# Patient Record
Sex: Male | Born: 1955 | Race: White | Hispanic: No | Marital: Married | Smoking: Never smoker
Health system: Southern US, Community
[De-identification: ages and names within clinical notes are randomized; demographics above are authoritative.]

## PROBLEM LIST (undated history)

## (undated) DIAGNOSIS — R079 Chest pain, unspecified: Secondary | ICD-10-CM

## (undated) DIAGNOSIS — T7840XA Allergy, unspecified, initial encounter: Secondary | ICD-10-CM

## (undated) HISTORY — DX: Allergy, unspecified, initial encounter: T78.40XA

## (undated) HISTORY — DX: Chest pain, unspecified: R07.9

## (undated) HISTORY — PX: OTHER SURGICAL HISTORY: SHX169

## (undated) HISTORY — PX: CAROTID STENT: SHX1301

---

## 2019-04-05 DIAGNOSIS — D229 Melanocytic nevi, unspecified: Secondary | ICD-10-CM

## 2019-04-05 HISTORY — DX: Melanocytic nevi, unspecified: D22.9

## 2020-04-01 DIAGNOSIS — M19041 Primary osteoarthritis, right hand: Secondary | ICD-10-CM | POA: Diagnosis not present

## 2020-04-01 DIAGNOSIS — M180 Bilateral primary osteoarthritis of first carpometacarpal joints: Secondary | ICD-10-CM | POA: Diagnosis not present

## 2020-04-01 DIAGNOSIS — M79641 Pain in right hand: Secondary | ICD-10-CM | POA: Diagnosis not present

## 2020-04-01 DIAGNOSIS — M79642 Pain in left hand: Secondary | ICD-10-CM | POA: Diagnosis not present

## 2020-04-01 DIAGNOSIS — G5601 Carpal tunnel syndrome, right upper limb: Secondary | ICD-10-CM | POA: Diagnosis not present

## 2020-05-13 DIAGNOSIS — G5601 Carpal tunnel syndrome, right upper limb: Secondary | ICD-10-CM | POA: Diagnosis not present

## 2020-05-13 DIAGNOSIS — G5603 Carpal tunnel syndrome, bilateral upper limbs: Secondary | ICD-10-CM | POA: Diagnosis not present

## 2020-06-08 ENCOUNTER — Other Ambulatory Visit: Payer: Self-pay

## 2020-06-08 ENCOUNTER — Ambulatory Visit: Payer: BC Managed Care – PPO | Admitting: Internal Medicine

## 2020-06-08 ENCOUNTER — Encounter: Payer: Self-pay | Admitting: Internal Medicine

## 2020-06-08 VITALS — BP 100/60 | HR 61 | Temp 98.1°F | Resp 16 | Ht 70.0 in | Wt 175.0 lb

## 2020-06-08 DIAGNOSIS — R5382 Chronic fatigue, unspecified: Secondary | ICD-10-CM | POA: Diagnosis not present

## 2020-06-08 DIAGNOSIS — E785 Hyperlipidemia, unspecified: Secondary | ICD-10-CM | POA: Insufficient documentation

## 2020-06-08 DIAGNOSIS — Z85820 Personal history of malignant melanoma of skin: Secondary | ICD-10-CM | POA: Diagnosis not present

## 2020-06-08 DIAGNOSIS — I2510 Atherosclerotic heart disease of native coronary artery without angina pectoris: Secondary | ICD-10-CM

## 2020-06-08 LAB — CBC WITH DIFFERENTIAL/PLATELET
Basophils Absolute: 0 10*3/uL (ref 0.0–0.1)
Basophils Relative: 0.4 % (ref 0.0–3.0)
Eosinophils Absolute: 0.1 10*3/uL (ref 0.0–0.7)
Eosinophils Relative: 1.2 % (ref 0.0–5.0)
HCT: 44.9 % (ref 39.0–52.0)
Hemoglobin: 15.4 g/dL (ref 13.0–17.0)
Lymphocytes Relative: 28 % (ref 12.0–46.0)
Lymphs Abs: 1.7 10*3/uL (ref 0.7–4.0)
MCHC: 34.4 g/dL (ref 30.0–36.0)
MCV: 92.3 fl (ref 78.0–100.0)
Monocytes Absolute: 0.6 10*3/uL (ref 0.1–1.0)
Monocytes Relative: 10.4 % (ref 3.0–12.0)
Neutro Abs: 3.7 10*3/uL (ref 1.4–7.7)
Neutrophils Relative %: 60 % (ref 43.0–77.0)
Platelets: 175 10*3/uL (ref 150.0–400.0)
RBC: 4.86 Mil/uL (ref 4.22–5.81)
RDW: 13.3 % (ref 11.5–15.5)
WBC: 6.1 10*3/uL (ref 4.0–10.5)

## 2020-06-08 LAB — BASIC METABOLIC PANEL
BUN: 19 mg/dL (ref 6–23)
CO2: 30 mEq/L (ref 19–32)
Calcium: 9.4 mg/dL (ref 8.4–10.5)
Chloride: 104 mEq/L (ref 96–112)
Creatinine, Ser: 1.08 mg/dL (ref 0.40–1.50)
GFR: 72.88 mL/min (ref >60.00)
Glucose, Bld: 89 mg/dL (ref 70–99)
Potassium: 4.1 mEq/L (ref 3.5–5.1)
Sodium: 140 mEq/L (ref 135–145)

## 2020-06-08 LAB — TSH: TSH: 2.15 u[IU]/mL (ref 0.35–4.50)

## 2020-06-08 LAB — HEPATIC FUNCTION PANEL
ALT: 38 U/L (ref 0–53)
AST: 26 U/L (ref 0–37)
Albumin: 4.2 g/dL (ref 3.5–5.2)
Alkaline Phosphatase: 52 U/L (ref 39–117)
Bilirubin, Direct: 0.1 mg/dL (ref 0.0–0.3)
Total Bilirubin: 0.7 mg/dL (ref 0.2–1.2)
Total Protein: 6.7 g/dL (ref 6.0–8.3)

## 2020-06-08 LAB — LIPID PANEL
Cholesterol: 116 mg/dL (ref 0–200)
HDL: 66.3 mg/dL (ref >39.00)
LDL Cholesterol: 37 mg/dL (ref 0–99)
NonHDL: 49.93
Total CHOL/HDL Ratio: 2
Triglycerides: 65 mg/dL (ref 0.0–149.0)
VLDL: 13 mg/dL (ref 0.0–40.0)

## 2020-06-08 NOTE — Progress Notes (Signed)
Subjective:  Patient ID: Steven Glenn, male    DOB: [birth year]  Age: 65 y.o. MRN: [id number]  CC: Coronary Artery Disease  This visit occurred during the SARS-CoV-2 public health emergency.  Safety protocols were in place, including screening questions prior to the visit, additional usage of staff PPE, and extensive cleaning of exam room while observing appropriate contact time as indicated for disinfecting solutions.    HPI Steven Glenn presents for f/up -   He recently moved to [place] from [place].  He had cardiac stents placed in 2014.  He also has a history of melanoma and elevated liver enzymes.  He has no medical records with him and there are no records available on care everywhere.  His only recent complaint is a several month history of fatigue.  He is very active and denies CP, DOE, palpitations, or edema.  History Steven Glenn has a past medical history of Allergy and Chest pain.   He has a past surgical history that includes Carotid stent and carpel tunnel.   His family history includes Healthy in his brother; Heart disease in his brother and father; Heart failure in his mother.He reports that he has never smoked. He has never used smokeless tobacco. He reports current alcohol use. He reports that he does not use drugs.  Outpatient Medications Prior to Visit  Medication Sig Dispense Refill  . aspirin 81 MG chewable tablet Chew by mouth daily.    . Loratadine (CLARITIN PO) Take by mouth.    . rosuvastatin (CRESTOR) 20 MG tablet Take 20 mg by mouth daily.     No facility-administered medications prior to visit.    ROS Review of Systems  Constitutional: Positive for fatigue. Negative for appetite change, chills, diaphoresis, fever and unexpected weight change.  HENT: Negative.   Eyes: Negative.   Respiratory: Negative for cough, chest tightness, shortness of breath and wheezing.   Cardiovascular: Negative for chest pain, palpitations and leg swelling.   Gastrointestinal: Negative for abdominal pain, constipation, diarrhea, nausea and vomiting.  Endocrine: Negative.   Genitourinary: Negative.  Negative for difficulty urinating.  Musculoskeletal: Positive for arthralgias (knees). Negative for myalgias.  Skin: Negative.  Negative for color change and rash.  Neurological: Negative.  Negative for dizziness, weakness, light-headedness and headaches.  Hematological: Negative for adenopathy. Does not bruise/bleed easily.  Psychiatric/Behavioral: Negative.     Objective:  BP 100/60   Pulse 61   Temp 98.1 F (36.7 C) (Oral)   Resp 16   Ht 5\' 10"  (1.778 m)   Wt 175 lb (79.4 kg)   SpO2 97%   BMI 25.11 kg/m   Physical Exam Vitals reviewed.  Constitutional:      Appearance: Normal appearance.  HENT:     Nose: Nose normal.     Mouth/Throat:     Mouth: Mucous membranes are moist.  Eyes:     General: No scleral icterus.    Conjunctiva/sclera: Conjunctivae normal.  Cardiovascular:     Rate and Rhythm: Regular rhythm. Bradycardia present.     Pulses: Normal pulses.     Heart sounds: No murmur heard.     Comments: EKG- Sinus bradycardia, 53 bpm RBBB No Q waves No old EKG for comparison Pulmonary:     Effort: Pulmonary effort is normal.     Breath sounds: No stridor. No wheezing, rhonchi or rales.  Abdominal:     General: Abdomen is flat. Bowel sounds are normal. There is no distension.     Palpations: Abdomen is  soft. There is no hepatomegaly, splenomegaly or mass.     Tenderness: There is no abdominal tenderness.  Musculoskeletal:        General: No swelling or tenderness. Normal range of motion.     Cervical back: Neck supple.  Lymphadenopathy:     Cervical: No cervical adenopathy.  Skin:    General: Skin is warm.  Neurological:     General: No focal deficit present.     Mental Status: He is alert.     Lab Results  Component Value Date   WBC 6.1 06/08/2020   HGB 15.4 06/08/2020   HCT 44.9 06/08/2020   PLT 175.0  06/08/2020   GLUCOSE 89 06/08/2020   CHOL 116 06/08/2020   TRIG 65.0 06/08/2020   HDL 66.30 06/08/2020   LDLCALC 37 06/08/2020   ALT 38 06/08/2020   AST 26 06/08/2020   NA 140 06/08/2020   K 4.1 06/08/2020   CL 104 06/08/2020   CREATININE 1.08 06/08/2020   BUN 19 06/08/2020   CO2 30 06/08/2020   TSH 2.15 06/08/2020    Assessment & Plan:   Steven Glenn was seen today for coronary artery disease.  Diagnoses and all orders for this visit:  Chronic fatigue- Labs are negative for causes. -     CBC with Differential/Platelet; Future -     Basic metabolic panel; Future -     TSH; Future -     TSH -     Basic metabolic panel -     CBC with Differential/Platelet  Coronary artery disease involving native coronary artery of native heart without angina pectoris- His EKG shows right bundle branch block.  I have no old EKGs for comparison.  His only complaint is fatigue.  This is not likely due to angina.  I have asked him to set up with a local cardiologist. -     Lipid panel; Future -     Ambulatory referral to Cardiology -     EKG 12-Lead -     Lipid panel  Hyperlipidemia LDL goal <70- He has achieved his LDL goal and is doing well on the statin. -     Lipid panel; Future -     TSH; Future -     Hepatic function panel; Future -     Hepatic function panel -     TSH -     Lipid panel  History of melanoma -     Ambulatory referral to Dermatology   I am having Steven Kells "Jon" maintain his rosuvastatin, Loratadine (CLARITIN PO), and aspirin.  No orders of the defined types were placed in this encounter.    Follow-up: Return in about 3 months (around 09/08/2020).  Scarlette Calico, MD

## 2020-06-08 NOTE — Patient Instructions (Signed)
Coronary Artery Disease, Male Coronary artery disease (CAD) is a condition in which the arteries that lead to the heart (coronary arteries) become narrow or blocked. The narrowing or blockage can lead to decreased blood flow to the heart. Prolonged reduced blood flow can cause a heart attack (myocardial infarction or MI). This condition may also be called coronary heart disease. Because CAD is the leading cause of death in men, it is important to understand what causes this condition and how it is treated. What are the causes? CAD is most often caused by atherosclerosis. This is the buildup of fat and cholesterol (plaque) on the inside of the arteries. Over time, the plaque may narrow or block the artery, reducing blood flow to the heart. Plaque can also become weak and break off within a coronary artery and cause a sudden blockage. Other less common causes of CAD include:  A blood clot or a piece of a blood clot or other substance that blocks the flow of blood in a coronary artery (embolism).  A tearing of the artery (spontaneous coronary artery dissection).  An enlargement of an artery (aneurysm).  Inflammation (vasculitis) in the artery wall.   What increases the risk? The following factors may make you more likely to develop this condition:  Age. Men over age 45 are at a greater risk of CAD.  Family history of CAD.  Gender. Men often develop CAD earlier in life than women.  High blood pressure (hypertension).  Diabetes.  High cholesterol levels.  Tobacco use.  Excessive alcohol use.  Lack of exercise.  A diet high in saturated and trans fats, such as fried food and processed meat. Other possible risk factors include:  High stress levels.  Depression.  Obesity.  Sleep apnea. What are the signs or symptoms? Many people do not have any symptoms during the early stages of CAD. As the condition progresses, symptoms may include:  Chest pain (angina). The pain can: ? Feel  like crushing or squeezing, or like a tightness, pressure, fullness, or heaviness in the chest. ? Last more than a few minutes or can stop and recur. The pain tends to get worse with exercise or stress and to fade with rest.  Pain in the arms, neck, jaw, ear, or back.  Unexplained heartburn or indigestion.  Shortness of breath.  Nausea or vomiting.  Sudden light-headedness.  Sudden cold sweats.  Fluttering or fast heartbeat (palpitations). How is this diagnosed? This condition is diagnosed based on:  Your family and medical history.  A physical exam.  Tests, including: ? A test to check the electrical signals in your heart (electrocardiogram). ? Exercise stress test. This looks for signs of blockage when the heart is stressed with exercise, such as running on a treadmill. ? Pharmacologic stress test. This test looks for signs of blockage when the heart is being stressed with a medicine. ? Blood tests. ? Coronary angiogram. This is a procedure to look at the coronary arteries to see if there is any blockage. During this test, a dye is injected into your arteries so they appear on an X-ray. ? Coronary artery CT scan. This CT scan helps detect calcium deposits in your coronary arteries. Calcium deposits are an indicator of CAD. ? A test that uses sound waves to take a picture of your heart (echocardiogram). ? Chest X-ray. How is this treated? This condition may be treated by:  Healthy lifestyle changes to reduce risk factors.  Medicines such as: ? Antiplatelet medicines and blood-thinning   medicines, such as aspirin. These help to prevent blood clots. ? Nitroglycerin. ? Blood pressure medicines. ? Cholesterol-lowering medicine.  Coronary angioplasty and stenting. During this procedure, a thin, flexible tube is inserted through a blood vessel and into a blocked artery. A balloon or similar device on the end of the tube is inflated to open up the artery. In some cases, a small,  mesh tube (stent) is inserted into the artery to keep it open.  Coronary artery bypass surgery. During this surgery, veins or arteries from other parts of the body are used to create a bypass around the blockage and allow blood to reach your heart. Follow these instructions at home: Medicines  Take over-the-counter and prescription medicines only as told by your health care provider.  Do not take the following medicines unless your health care provider approves: ? NSAIDs, such as ibuprofen, naproxen, or celecoxib. ? Vitamin supplements that contain vitamin A, vitamin E, or both. Lifestyle  Follow an exercise program approved by your health care provider. Aim for 150 minutes of moderate exercise or 75 minutes of vigorous exercise each week.  Maintain a healthy weight or lose weight as approved by your health care provider.  Learn to manage stress or try to limit your stress. Ask your health care provider for suggestions if you need help.  Get screened for depression and seek treatment, if needed.  Do not use any products that contain nicotine or tobacco, such as cigarettes, e-cigarettes, and chewing tobacco. If you need help quitting, ask your health care provider.  Do not use illegal drugs. Eating and drinking  Follow a heart-healthy diet. A dietitian can help educate you about healthy food options and changes. In general, eat plenty of fruits and vegetables, lean meats, and whole grains.  Avoid foods high in: ? Sugar. ? Salt (sodium). ? Saturated fat, such as processed or fatty meat. ? Trans fat, such as fried foods.  Use healthy cooking methods such as roasting, grilling, broiling, baking, poaching, steaming, or stir-frying.  Do not drink alcohol if your health care provider tells you not to drink.  If you drink alcohol: ? Limit how much you have to 0-2 drinks per day. ? Be aware of how much alcohol is in your drink. In the [place] one drink equals one 12 oz bottle of beer  (355 mL), one 5 oz glass of wine (148 mL), or one 1 oz glass of hard liquor (44 mL).   General instructions  Manage any other health conditions, such as hypertension and diabetes. These conditions affect your heart.  Your health care provider may ask you to monitor your blood pressure. Ideally, your blood pressure should be below 130/80.  Keep all follow-up visits as told by your health care provider. This is important. Get help right away if:  You have pain in your chest, neck, ear, arm, jaw, stomach, or back that: ? Lasts more than a few minutes. ? Is recurring. ? Is not relieved by taking medicine under your tongue (sublingual nitroglycerin).  You have profuse sweating without cause.  You have unexplained: ? Heartburn or indigestion. ? Shortness of breath or difficulty breathing. ? Fluttering or fast heartbeat (palpitations). ? Nausea or vomiting. ? Fatigue. ? Feelings of nervousness or anxiety. ? Weakness. ? Diarrhea.  You have sudden light-headedness or dizziness.  You faint.  You feel like hurting yourself or think about taking your own life. These symptoms may represent a serious problem that is an emergency. Do not wait to   see if the symptoms will go away. Get medical help right away. Call your local emergency services (911 in the [place] Do not drive yourself to the hospital. Summary  Coronary artery disease (CAD) is a condition in which the arteries that lead to the heart (coronary arteries) become narrow or blocked. The narrowing or blockage can lead to a heart attack.  Many people do not have any symptoms during the early stages of CAD.  CAD can be treated with lifestyle changes, medicines, surgery, or a combination of these treatments. This information is not intended to replace advice given to you by your health care provider. Make sure you discuss any questions you have with your health care provider. Document Revised: 12/08/2017 Document Reviewed:  11/28/2017 Elsevier Patient Education  2021 [employer] Inc.  

## 2020-07-06 ENCOUNTER — Other Ambulatory Visit: Payer: Self-pay

## 2020-07-06 ENCOUNTER — Ambulatory Visit: Payer: BC Managed Care – PPO | Admitting: Internal Medicine

## 2020-07-06 ENCOUNTER — Encounter: Payer: Self-pay | Admitting: Internal Medicine

## 2020-07-06 VITALS — BP 130/82 | HR 51 | Ht 70.0 in | Wt 178.4 lb

## 2020-07-06 DIAGNOSIS — I2510 Atherosclerotic heart disease of native coronary artery without angina pectoris: Secondary | ICD-10-CM | POA: Diagnosis not present

## 2020-07-06 DIAGNOSIS — E785 Hyperlipidemia, unspecified: Secondary | ICD-10-CM

## 2020-07-06 DIAGNOSIS — R5382 Chronic fatigue, unspecified: Secondary | ICD-10-CM | POA: Diagnosis not present

## 2020-07-06 MED ORDER — ROSUVASTATIN CALCIUM 20 MG PO TABS: 20.0000 mg | ORAL_TABLET | Freq: Every day | ORAL | 3 refills | Status: DC

## 2020-07-06 NOTE — Patient Instructions (Signed)
Medication Instructions:  CRESTOR-REFILLED *If you need a refill on your cardiac medications before your next appointment, please call your pharmacy*  Testing/Procedures: Your physician has requested that you have an echocardiogram PLEASE SCHEDULE ON MAY 18th. Echocardiography is a painless test that uses sound waves to create images of your heart. It provides your doctor with information about the size and shape of your heart and how well your heart's chambers and valves are working. You may receive an ultrasound enhancing agent through an IV if needed to better visualize your heart during the echo.This procedure takes approximately one hour. There are no restrictions for this procedure. This will take place at the [address], [address].   Follow-Up: At [facility], you and your health needs are our priority.  As part of our continuing mission to provide you with exceptional heart care, we have created designated Provider Care Teams.  These Care Teams include your primary Cardiologist (physician) and Advanced Practice Providers (APPs -  Physician Assistants and Nurse Practitioners) who all work together to provide you with the care you need, when you need it.  Your next appointment:   1 year(s)  The format for your next appointment:   In Person  Provider:   Cherlynn Kaiser, MD

## 2020-07-06 NOTE — Progress Notes (Signed)
Cardiology Office Note:    Date:  07/06/2020   ID:  Steven Glenn, DOB 1955-10-13, MRN [id number]  PCP:  Janith Lima, MD  Cardiologist:  No primary care provider on file.  Electrophysiologist:  None   Referring MD: Janith Lima, MD   Chief Complaint/Reason for Referral: CAD  History of Present Illness:    Steven Glenn is a 65 y.o. male with a history of CAD, melanoma, elevated LFTs, carpal tunnel right hand.   2014- >90% blockage LAD, stent - Resolute stent. Plavix for 2 years, then aspirin alone.   Crestor 20 mg, well tolerated. Got heartburn with Lipitor.  Snores, not screened for sleep apnea.   C/o fatigue. The patient denies chest pain, chest pressure, dyspnea at rest or with exertion, palpitations, PND, orthopnea, or leg swelling. Denies cough, fever, chills. Denies nausea, vomiting. Denies syncope or presyncope. Denies dizziness or lightheadedness.   Past Medical History:  Diagnosis Date  . Allergy   . Chest pain     Past Surgical History:  Procedure Laterality Date  . CAROTID STENT    . carpel tunnel      Current Medications: Current Meds  Medication Sig  . aspirin 81 MG chewable tablet Chew by mouth daily.  . Loratadine (CLARITIN PO) Take by mouth.  . rosuvastatin (CRESTOR) 20 MG tablet Take 20 mg by mouth daily.     Allergies:   Patient has no allergy information on record.   Social History   Tobacco Use  . Smoking status: Never Smoker  . Smokeless tobacco: Never Used  Substance Use Topics  . Alcohol use: Yes  . Drug use: Never     Family History: The patient's family history includes Healthy in his brother; Heart disease in his brother and father; Heart failure in his mother.  ROS:   Please see the history of present illness.    All other systems reviewed and are negative.  EKGs/Labs/Other Studies Reviewed:    The following studies were reviewed today:  EKG:  Sinus bradycardia, 1st deg AVB, RBBB   Recent  Labs: 06/08/2020: ALT 38; BUN 19; Creatinine, Ser 1.08; Hemoglobin 15.4; Platelets 175.0; Potassium 4.1; Sodium 140; TSH 2.15  Recent Lipid Panel    Component Value Date/Time   CHOL 116 06/08/2020 1508   TRIG 65.0 06/08/2020 1508   HDL 66.30 06/08/2020 1508   CHOLHDL 2 06/08/2020 1508   VLDL 13.0 06/08/2020 1508   LDLCALC 37 06/08/2020 1508    Physical Exam:    VS:  BP 130/82   Pulse (!) 51   Ht 5\' 10"  (1.778 m)   Wt 178 lb 6.4 oz (80.9 kg)   SpO2 99%   BMI 25.60 kg/m     Wt Readings from Last 5 Encounters:  06/08/20 175 lb (79.4 kg)    Constitutional: No acute distress Eyes: sclera non-icteric, normal conjunctiva and lids ENMT: normal dentition, moist mucous membranes Cardiovascular: regular rhythm, bradycardic rate, no murmurs. S1 and S2 normal. Radial pulses normal bilaterally. No jugular venous distention.  Respiratory: clear to auscultation bilaterally GI : normal bowel sounds, soft and nontender. No distention.   MSK: extremities warm, well perfused. No edema.  NEURO: grossly nonfocal exam, moves all extremities. PSYCH: alert and oriented x 3, normal mood and affect.   ASSESSMENT:    1. Coronary artery disease involving native coronary artery of native heart without angina pectoris   2. Hyperlipidemia LDL goal <70   3. Chronic fatigue    PLAN:  Coronary artery disease involving native coronary artery of native heart without angina pectoris - Plan: EKG 12-Lead, ECHOCARDIOGRAM COMPLETE - has not had an echo in several years, and has some fatigue. Will establish baseline with echocardiogram now to evaluate EF and structural heart disease s/p CAD with PCI.   Hyperlipidemia LDL goal <70 - lipids well controlled on crestor, continue.    Total time of encounter: 45 minutes total time of encounter, including 30 minutes spent in face-to-face patient care on the date of this encounter. This time includes coordination of care and counseling regarding above mentioned  problem list. Remainder of non-face-to-face time involved reviewing chart documents/testing relevant to the patient encounter and documentation in the medical record. I have independently reviewed documentation from referring provider.   Cherlynn Kaiser, MD, [facility]    Medication Adjustments/Labs and Tests Ordered: Current medicines are reviewed at length with the patient today.  Concerns regarding medicines are outlined above.   Orders Placed This Encounter  Procedures  . EKG 12-Lead  . ECHOCARDIOGRAM COMPLETE    Meds ordered this encounter  Medications  . rosuvastatin (CRESTOR) 20 MG tablet    Sig: Take 1 tablet (20 mg total) by mouth daily.    Dispense:  90 tablet    Refill:  3    Patient Instructions  Medication Instructions:  CRESTOR-REFILLED *If you need a refill on your cardiac medications before your next appointment, please call your pharmacy*  Testing/Procedures: Your physician has requested that you have an echocardiogram PLEASE SCHEDULE ON MAY 18th. Echocardiography is a painless test that uses sound waves to create images of your heart. It provides your doctor with information about the size and shape of your heart and how well your heart's chambers and valves are working. You may receive an ultrasound enhancing agent through an IV if needed to better visualize your heart during the echo.This procedure takes approximately one hour. There are no restrictions for this procedure. This will take place at the [address].   Follow-Up: At [facility], you and your health needs are our priority.  As part of our continuing mission to provide you with exceptional heart care, we have created designated Provider Care Teams.  These Care Teams include your primary Cardiologist (physician) and Advanced Practice Providers (APPs -  Physician Assistants and Nurse Practitioners) who all work together to provide you with the care you need, when  you need it.  Your next appointment:   1 year(s)  The format for your next appointment:   In Person  Provider:   Cherlynn Kaiser, MD

## 2020-08-19 ENCOUNTER — Ambulatory Visit (HOSPITAL_COMMUNITY): Payer: BC Managed Care – PPO | Attending: Internal Medicine

## 2020-08-19 ENCOUNTER — Other Ambulatory Visit: Payer: Self-pay

## 2020-08-19 DIAGNOSIS — I2510 Atherosclerotic heart disease of native coronary artery without angina pectoris: Secondary | ICD-10-CM | POA: Insufficient documentation

## 2020-08-20 LAB — ECHOCARDIOGRAM COMPLETE
Area-P 1/2: 4.06 cm2
P 1/2 time: 860 msec
S' Lateral: 2.6 cm

## 2020-08-30 DIAGNOSIS — M79644 Pain in right finger(s): Secondary | ICD-10-CM | POA: Diagnosis not present

## 2020-09-26 ENCOUNTER — Ambulatory Visit (HOSPITAL_COMMUNITY)
Admission: EM | Admit: 2020-09-26 | Discharge: 2020-09-26 | Disposition: A | Discharge status: Home / Self Care | Attending: Medical Oncology | Admitting: Medical Oncology

## 2020-09-26 ENCOUNTER — Encounter (HOSPITAL_COMMUNITY): Payer: Self-pay

## 2020-09-26 ENCOUNTER — Ambulatory Visit (INDEPENDENT_AMBULATORY_CARE_PROVIDER_SITE_OTHER)

## 2020-09-26 DIAGNOSIS — W19XXXA Unspecified fall, initial encounter: Secondary | ICD-10-CM

## 2020-09-26 DIAGNOSIS — M79642 Pain in left hand: Secondary | ICD-10-CM | POA: Diagnosis not present

## 2020-09-26 DIAGNOSIS — M25562 Pain in left knee: Secondary | ICD-10-CM

## 2020-09-26 NOTE — ED Triage Notes (Signed)
Pt present he was running this am when he fall and caught his fall with his left hand. Pt hand is red and slightly swollen.

## 2020-09-26 NOTE — ED Provider Notes (Signed)
[facility]    CSN: [id number] Arrival date & time: 09/26/20  1009      History   Chief Complaint Chief Complaint  Patient presents with   Fall    HPI Steven Glenn is a 65 y.o. male.   HPI  Fall: Patient reports that he was running today when he accidentally tripped and fell.  He states that he extended his left hand to catch his fall resulting in hand pain similar to when he broke his wrist the last time he fell many years ago.  He states that he did not hit his head or have loss of consciousness.  A few bumps and scrapes of his left knee as well but otherwise is unhurt.  He took 2 Motrin a few minutes ago to help with pain and inflammation.  He does have mild numbness and tingling of the fourth and fifth finger.  He states that making a fist is painful and he has some pain worse in the lateral aspect of the left hand. He reports that he is UTD on his Tdap. 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  Past Medical History:  Diagnosis Date   Allergy    Chest pain     Patient Active Problem List   Diagnosis Date Noted   Chronic fatigue 06/08/2020   Coronary artery disease involving native coronary artery of native heart without angina pectoris 06/08/2020   Hyperlipidemia LDL goal <70 06/08/2020   History of melanoma 06/08/2020    Past Surgical History:  Procedure Laterality Date   CAROTID STENT     carpel tunnel         Home Medications    Prior to Admission medications   Medication Sig Start Date End Date Taking? Authorizing Provider  aspirin 81 MG chewable tablet Chew by mouth daily.    [provider]  Loratadine (CLARITIN PO) Take by mouth.    [provider]  rosuvastatin (CRESTOR) 20 MG tablet Take 1 tablet (20 mg total) by mouth daily. 07/06/20   Elouise Munroe, MD    Family History Family History  Problem Relation Age of Onset   Heart failure Mother    Heart disease Father    Heart disease  Brother    Healthy Brother     Social History Social History   Tobacco Use   Smoking status: Never   Smokeless tobacco: Never  Substance Use Topics   Alcohol use: Yes   Drug use: Never     Allergies   Patient has no known allergies.   Review of Systems Review of Systems  As stated above in HPI Physical Exam Triage Vital Signs ED Triage Vitals  Enc Vitals Group     BP 09/26/20 1042 123/82     Pulse Rate 09/26/20 1042 (!) 46     Resp 09/26/20 1042 16     Temp 09/26/20 1042 97.7 F (36.5 C)     Temp Source 09/26/20 1042 Oral     SpO2 09/26/20 1042 100 %     Weight --      Height --      Head Circumference --      Peak Flow --      Pain Score 09/26/20 1041 8     Pain Loc --      Pain Edu? --      Excl. in [place]? --    No data found.  Updated Vital Signs BP  123/82 (BP Location: Left Arm)   Pulse (!) 46   Temp 97.7 F (36.5 C) (Oral)   Resp 16   SpO2 100%   Physical Exam Vitals and nursing note reviewed.  Constitutional:      General: He is not in acute distress.    Appearance: Normal appearance. He is not ill-appearing, toxic-appearing or diaphoretic.  HENT:     Head: Normocephalic and atraumatic.  Cardiovascular:     Rate and Rhythm: Regular rhythm. Bradycardia present.     Pulses: Normal pulses.  Musculoskeletal:     Cervical back: Neck supple.     Comments: There is mild to moderate edema of the left hand.  He is able to wiggle all fingers.  He is able to make a light fist with the left hand comfortably.  There is some bruising around the base of the third and fourth digit.   Skin:    General: Skin is warm.     Capillary Refill: Capillary refill takes less than 2 seconds.     Comments: Few superficial cuts and scrapes of left hand and left knee.  No sign of foreign body or active bleeding.  Neurological:     Mental Status: He is alert and oriented to person, place, and time.     Comments: Gross sensation intact     UC Treatments / Results   Labs (all labs ordered are listed, but only abnormal results are displayed) Labs Reviewed - No data to display  EKG   Radiology No results found.  Procedures Procedures (including critical care time)  Medications Ordered in UC Medications - No data to display  Initial Impression / Assessment and Plan / UC Course  I have reviewed the triage vital signs and the nursing notes.  Pertinent labs & imaging results that were available during my care of the patient were reviewed by me and considered in my medical decision making (see chart for details).     New.  Patient was wearing his wedding ring and had some increased erythema of the distal finger.  Patient was provided with lubricant and he was able to successfully remove the wedding ring on his own.  Imaging of the hand in process.  UPDATE: No fractures. ACE wrap and bandaging wounds. Discussed RICE along with red flag signs and symptoms. Follow up PRN.  Final Clinical Impressions(s) / UC Diagnoses   Final diagnoses:  None   Discharge Instructions   None    ED Prescriptions   None    PDMP not reviewed this encounter.   Hughie Closs, [place] 09/26/20 1134

## 2020-11-09 NOTE — Telephone Encounter (Signed)
Please stamp and scan into chart.  GA   EKG printed, stamped and placed to be scanned into chart.

## 2020-11-23 ENCOUNTER — Ambulatory Visit: Admitting: Dermatology

## 2021-02-23 ENCOUNTER — Encounter: Admitting: Internal Medicine

## 2021-03-09 ENCOUNTER — Encounter: Payer: Self-pay | Admitting: Dermatology

## 2021-03-09 ENCOUNTER — Ambulatory Visit (INDEPENDENT_AMBULATORY_CARE_PROVIDER_SITE_OTHER): Admitting: Dermatology

## 2021-03-09 ENCOUNTER — Other Ambulatory Visit: Payer: Self-pay

## 2021-03-09 DIAGNOSIS — Z85820 Personal history of malignant melanoma of skin: Secondary | ICD-10-CM

## 2021-03-09 DIAGNOSIS — L821 Other seborrheic keratosis: Secondary | ICD-10-CM | POA: Diagnosis not present

## 2021-03-09 DIAGNOSIS — L111 Transient acantholytic dermatosis [Grover]: Secondary | ICD-10-CM | POA: Diagnosis not present

## 2021-03-09 DIAGNOSIS — Z1283 Encounter for screening for malignant neoplasm of skin: Secondary | ICD-10-CM | POA: Diagnosis not present

## 2021-03-11 ENCOUNTER — Encounter: Payer: Self-pay | Admitting: Dermatology

## 2021-03-15 ENCOUNTER — Telehealth: Payer: Self-pay | Admitting: *Deleted

## 2021-03-15 MED ORDER — CLOBETASOL PROP EMOLLIENT BASE 0.05 % EX CREA: TOPICAL_CREAM | CUTANEOUS | 3 refills | Status: DC

## 2021-03-15 NOTE — Telephone Encounter (Signed)
Patient received dr Denna Haggard via Deloris Ping- prescription that dr tafeen recommended sent in to patient pharmacy

## 2021-03-28 ENCOUNTER — Encounter: Payer: Self-pay | Admitting: Dermatology

## 2021-03-28 NOTE — Progress Notes (Signed)
° °  New Patient   Subjective  Steven Glenn is a 65 y.o. male who presents for the following: Annual Exam (Skin check, 2006 left side and right post shoulder mm ).  General skin check, history of melanoma Location:  Duration:  Quality:  Associated Signs/Symptoms: Modifying Factors:  Severity:  Timing: Context:    The following portions of the chart were reviewed this encounter and updated as appropriate:  Tobacco   Allergies   Meds   Problems   Med Hx   Surg Hx   Fam Hx       Objective  Well appearing patient in no apparent distress; mood and affect are within normal limits. Torso - Posterior (Back) Brown textured flattopped 6 mm papules typical dermoscopy  Left Abdomen (side) - Lower Scars clear  Left Lower Back, Right Lower Back Multiple monomorphic slightly inflamed 3 millimeter     A full examination was performed including scalp, head, eyes, ears, nose, lips, neck, chest, axillae, abdomen, back, buttocks, bilateral upper extremities, bilateral lower extremities, hands, feet, fingers, toes, fingernails, and toenails. All findings within normal limits unless otherwise noted below.   Assessment & Plan  Seborrheic keratosis Torso - Posterior (Back)  Leave stable  Personal history of malignant melanoma of skin Left Abdomen (side) - Lower  Per pt 2006, Left side and posterior Right shoulder.  Annual skin examination.  Patient encouraged to self examine twice annually.  Grover's disease (2) Left Lower Back; Right Lower Back  Patient contacted me via [place] before this encounter was completed; we will send in a prescription for clobetasol cream on Monday which he will apply to the area with the rash daily after bathing for 1 month and then get back to [place] with a status update

## 2021-05-07 ENCOUNTER — Other Ambulatory Visit: Payer: Self-pay

## 2021-05-07 ENCOUNTER — Ambulatory Visit
Admission: EM | Admit: 2021-05-07 | Discharge: 2021-05-07 | Disposition: A | Discharge status: Home / Self Care | Attending: Emergency Medicine | Admitting: Emergency Medicine

## 2021-05-07 DIAGNOSIS — H6091 Unspecified otitis externa, right ear: Secondary | ICD-10-CM

## 2021-05-07 DIAGNOSIS — H6691 Otitis media, unspecified, right ear: Secondary | ICD-10-CM | POA: Diagnosis not present

## 2021-05-07 DIAGNOSIS — H9201 Otalgia, right ear: Secondary | ICD-10-CM

## 2021-05-07 MED ORDER — CEFDINIR 300 MG PO CAPS: 300.0000 mg | ORAL_CAPSULE | Freq: Two times a day (BID) | ORAL | 0 refills | Status: AC

## 2021-05-07 MED ORDER — CIPROFLOXACIN-DEXAMETHASONE 0.3-0.1 % OT SUSP: 4.0000 [drp] | Freq: Two times a day (BID) | OTIC | 0 refills | Status: DC

## 2021-05-07 NOTE — ED Triage Notes (Signed)
Pt reports having chronic ear infection (right), he reports having drainage and itching to the ear.

## 2021-05-07 NOTE — ED Provider Notes (Signed)
[facility]    CSN: [id number] Arrival date & time: 05/07/21  1412    HISTORY   Chief Complaint  Patient presents with   Ear Drainage   HPI Steven Glenn is a 66 y.o. male. Pt reports having recurrent ear infection, he states he is having drainage and itching in his right ear.  Patient states he was treated about a month ago with amoxicillin for ear infection in the same ear.  Patient states that infections vary from ear to ear.  Patient states he attempted to get an ear nose and throat appointment today but was unable to get one until next Friday and is going out of town this coming weekend.  Patient is requesting treatment today.  The history is provided by the patient.  Past Medical History:  Diagnosis Date   Allergy    Atypical mole 2021   left proximal post thigh lentiginous compound nevus Associated dermatology   Chest pain    Patient Active Problem List   Diagnosis Date Noted   Chronic fatigue 06/08/2020   Coronary artery disease involving native coronary artery of native heart without angina pectoris 06/08/2020   Hyperlipidemia LDL goal <70 06/08/2020   History of melanoma 06/08/2020   Past Surgical History:  Procedure Laterality Date   CAROTID STENT     carpel tunnel      Home Medications    Prior to Admission medications   Medication Sig Start Date End Date Taking? Authorizing Provider  aspirin 81 MG chewable tablet Chew by mouth daily.    [provider]  Clobetasol Prop Emollient Base (CLOBETASOL PROPIONATE E) 0.05 % emollient cream Apply to affected area- Do not use on face, folds or groin 03/15/21   Lavonna Monarch, MD  Loratadine (CLARITIN PO) Take by mouth.    [provider]  rosuvastatin (CRESTOR) 20 MG tablet Take 1 tablet (20 mg total) by mouth daily. 07/06/20   Elouise Munroe, MD   Family History Family History  Problem Relation Age of Onset   Heart failure Mother    Heart disease Father    Heart disease  Brother    Healthy Brother    Social History Social History   Tobacco Use   Smoking status: Never   Smokeless tobacco: Never  Vaping Use   Vaping Use: Never used  Substance Use Topics   Alcohol use: Yes   Drug use: Never   Allergies   Patient has no known allergies.  Review of Systems Review of Systems Pertinent findings noted in history of present illness.   Physical Exam Triage Vital Signs ED Triage Vitals  Enc Vitals Group     BP 01/29/21 0827 (!) 147/82     Pulse Rate 01/29/21 0827 72     Resp 01/29/21 0827 18     Temp 01/29/21 0827 98.3 F (36.8 C)     Temp Source 01/29/21 0827 Oral     SpO2 01/29/21 0827 98 %     Weight --      Height --      Head Circumference --      Peak Flow --      Pain Score 01/29/21 0826 5     Pain Loc --      Pain Edu? --      Excl. in [place]? --   No data found.  Updated Vital Signs BP 132/74 (BP Location: Left Arm)    Pulse (!) 55    Temp (!) 97.5  F (36.4 C) (Oral)    Resp 18    SpO2 98%   Physical Exam Vitals and nursing note reviewed.  Constitutional:      General: He is not in acute distress.    Appearance: Normal appearance. He is not ill-appearing.  HENT:     Head: Normocephalic and atraumatic.     Salivary Glands: Right salivary gland is not diffusely enlarged or tender. Left salivary gland is not diffusely enlarged or tender.     Right Ear: External ear normal. Decreased hearing noted. No drainage. A middle ear effusion is present. There is no impacted cerumen. Tympanic membrane is erythematous and bulging.     Left Ear: Tympanic membrane, ear canal and external ear normal. Decreased hearing noted. No drainage.  No middle ear effusion. There is no impacted cerumen. Tympanic membrane is not erythematous or bulging.     Ears:     Comments: Purulent material appreciated in right ear canal, patient has decreased hearing bilaterally, wears hearing aids.    Nose: Nose normal. No nasal deformity, septal deviation, mucosal edema,  congestion or rhinorrhea.     Right Turbinates: Not enlarged, swollen or pale.     Left Turbinates: Not enlarged, swollen or pale.     Right Sinus: No maxillary sinus tenderness or frontal sinus tenderness.     Left Sinus: No maxillary sinus tenderness or frontal sinus tenderness.     Mouth/Throat:     Lips: Pink. No lesions.     Mouth: Mucous membranes are moist. No oral lesions.     Pharynx: Oropharynx is clear. Uvula midline. No posterior oropharyngeal erythema or uvula swelling.     Tonsils: No tonsillar exudate. 0 on the right. 0 on the left.  Eyes:     General: Lids are normal.        Right eye: No discharge.        Left eye: No discharge.     Extraocular Movements: Extraocular movements intact.     Conjunctiva/sclera: Conjunctivae normal.     Right eye: Right conjunctiva is not injected.     Left eye: Left conjunctiva is not injected.  Neck:     Trachea: Trachea and phonation normal.  Cardiovascular:     Rate and Rhythm: Normal rate and regular rhythm.     Pulses: Normal pulses.     Heart sounds: Normal heart sounds. No murmur heard.   No friction rub. No gallop.  Pulmonary:     Effort: Pulmonary effort is normal. No accessory muscle usage, prolonged expiration or respiratory distress.     Breath sounds: Normal breath sounds. No stridor, decreased air movement or transmitted upper airway sounds. No decreased breath sounds, wheezing, rhonchi or rales.  Chest:     Chest wall: No tenderness.  Musculoskeletal:        General: Normal range of motion.     Cervical back: Normal range of motion and neck supple. Normal range of motion.  Lymphadenopathy:     Cervical: No cervical adenopathy.  Skin:    General: Skin is warm and dry.     Findings: No erythema or rash.  Neurological:     General: No focal deficit present.     Mental Status: He is alert and oriented to person, place, and time.  Psychiatric:        Mood and Affect: Mood normal.        Behavior: Behavior normal.     Visual Acuity Right Eye Distance:   Left  Eye Distance:   Bilateral Distance:    Right Eye Near:   Left Eye Near:    Bilateral Near:     UC Couse / Diagnostics / Procedures:    EKG  Radiology No results found.  Procedures Procedures (including critical care time)  UC Diagnoses / Final Clinical Impressions(s)   I have reviewed the triage vital signs and the nursing notes.  Pertinent labs & imaging results that were available during my care of the patient were reviewed by me and considered in my medical decision making (see chart for details).   Final diagnoses:  Otalgia of right ear  Otitis externa of right ear, unspecified chronicity, unspecified type  Right otitis media, unspecified otitis media type   Patient to begin Ciprodex and cefdinir given that he is going out of town.  Patient states he plans to follow-up with ENT when he returns.  ED Prescriptions     Medication Sig Dispense Auth. Provider   cefdinir (OMNICEF) 300 MG capsule Take 1 capsule (300 mg total) by mouth 2 (two) times daily for 10 days. 20 capsule Lynden Oxford Scales, PA-C   ciprofloxacin-dexamethasone (CIPRODEX) OTIC suspension Place 4 drops into the right ear 2 (two) times daily. 7.5 mL Lynden Oxford Scales, PA-C      PDMP not reviewed this encounter.  Pending results:  Labs Reviewed - No data to display  Medications Ordered in UC: Medications - No data to display  Disposition Upon Discharge:  Condition: stable for discharge home Home: take medications as prescribed; routine discharge instructions as discussed; follow up as advised.  Patient presented with an acute illness with associated systemic symptoms and significant discomfort requiring urgent management. In my opinion, this is a condition that a prudent lay person (someone who possesses an average knowledge of health and medicine) may potentially expect to result in complications if not addressed urgently such as respiratory  distress, impairment of bodily function or dysfunction of bodily organs.   Routine symptom specific, illness specific and/or disease specific instructions were discussed with the patient and/or caregiver at length.   As such, the patient has been evaluated and assessed, work-up was performed and treatment was provided in alignment with urgent care protocols and evidence based medicine.  Patient/parent/caregiver has been advised that the patient may require follow up for further testing and treatment if the symptoms continue in spite of treatment, as clinically indicated and appropriate.  If the patient was tested for COVID-19, Influenza and/or RSV, then the patient/parent/guardian was advised to isolate at home pending the results of his/her diagnostic coronavirus test and potentially longer if theyre positive. I have also advised pt that if his/her COVID-19 test returns positive, it's recommended to self-isolate for at least 10 days after symptoms first appeared AND until fever-free for 24 hours without fever reducer AND other symptoms have improved or resolved. Discussed self-isolation recommendations as well as instructions for household member/close contacts as per the [facility] and [place] DHHS, and also gave patient the [place] packet with this information.  Patient/parent/caregiver has been advised to return to the [facility] or PCP in 3-5 days if no better; to PCP or the Emergency Department if new signs and symptoms develop, or if the current signs or symptoms continue to change or worsen for further workup, evaluation and treatment as clinically indicated and appropriate  The patient will follow up with their current PCP if and as advised. If the patient does not currently have a PCP we will assist them in obtaining one.  The patient may need specialty follow up if the symptoms continue, in spite of conservative treatment and management, for further workup, evaluation, consultation and treatment as clinically  indicated and appropriate.  Patient/parent/caregiver verbalized understanding and agreement of plan as discussed.  All questions were addressed during visit.  Please see discharge instructions below for further details of plan.  Discharge Instructions:   Discharge Instructions      To treat both internal and external ear infection in your right ear, please begin cefdinir, 1 capsule twice daily for 10 days and ciprofloxacin eardrops, 4 drops into your right ear twice daily.  Please do follow-up with ear nose and throat for reevaluation when you return from your trip.  Please let us know if there is anything else we can do for you.  Thank you for visiting urgent care today.      This office note has been dictated using Museum/gallery curator.  Unfortunately, and despite my best efforts, this method of dictation can sometimes lead to occasional typographical or grammatical errors.  I apologize in advance if this occurs.     Lynden Oxford Scales, PA-C 05/07/21 1515

## 2021-05-07 NOTE — Discharge Instructions (Signed)
To treat both internal and external ear infection in your right ear, please begin cefdinir, 1 capsule twice daily for 10 days and ciprofloxacin eardrops, 4 drops into your right ear twice daily.  Please do follow-up with ear nose and throat for reevaluation when you return from your trip.  Please let us know if there is anything else we can do for you.  Thank you for visiting urgent care today.

## 2021-06-14 ENCOUNTER — Ambulatory Visit
Admission: EM | Admit: 2021-06-14 | Discharge: 2021-06-14 | Disposition: A | Discharge status: Home / Self Care | Attending: Emergency Medicine | Admitting: Emergency Medicine

## 2021-06-14 ENCOUNTER — Other Ambulatory Visit: Payer: Self-pay

## 2021-06-14 DIAGNOSIS — J209 Acute bronchitis, unspecified: Secondary | ICD-10-CM

## 2021-06-14 DIAGNOSIS — H66001 Acute suppurative otitis media without spontaneous rupture of ear drum, right ear: Secondary | ICD-10-CM

## 2021-06-14 MED ORDER — CEFDINIR 300 MG PO CAPS: 300.0000 mg | ORAL_CAPSULE | Freq: Two times a day (BID) | ORAL | 0 refills | Status: AC

## 2021-06-14 MED ORDER — IBUPROFEN 400 MG PO TABS: 400.0000 mg | ORAL_TABLET | Freq: Three times a day (TID) | ORAL | 0 refills | Status: AC | PRN

## 2021-06-14 NOTE — ED Provider Notes (Signed)
[facility]    CSN: [id number] Arrival date & time: 06/14/21  1007    HISTORY   Chief Complaint  Patient presents with   Headache   Nasal Congestion   HPI Steven Glenn is a 66 y.o. male. Pt c/o having a cold, headache, sinus drainage, sore throat and feeling pressure in his chest with coughing.  Patient states that his right ear has also been bothering him, states he was seen a few weeks ago for right ear infection which got better but seems to be coming back.  The history is provided by the patient.  Past Medical History:  Diagnosis Date   Allergy    Atypical mole 2021   left proximal post thigh lentiginous compound nevus Associated dermatology   Chest pain    Patient Active Problem List   Diagnosis Date Noted   Chronic fatigue 06/08/2020   Coronary artery disease involving native coronary artery of native heart without angina pectoris 06/08/2020   Hyperlipidemia LDL goal <70 06/08/2020   History of melanoma 06/08/2020   Past Surgical History:  Procedure Laterality Date   CAROTID STENT     carpel tunnel      Home Medications    Prior to Admission medications   Medication Sig Start Date End Date Taking? Authorizing Provider  cefdinir (OMNICEF) 300 MG capsule Take 1 capsule (300 mg total) by mouth 2 (two) times daily for 10 days. 06/14/21 06/24/21 Yes Lynden Oxford Scales, PA-C  aspirin 81 MG chewable tablet Chew by mouth daily.    [provider]  Clobetasol Prop Emollient Base (CLOBETASOL PROPIONATE E) 0.05 % emollient cream Apply to affected area- Do not use on face, folds or groin 03/15/21   Lavonna Monarch, MD  Loratadine (CLARITIN PO) Take by mouth.    [provider]  rosuvastatin (CRESTOR) 20 MG tablet Take 1 tablet (20 mg total) by mouth daily. 07/06/20   Elouise Munroe, MD   Family History Family History  Problem Relation Age of Onset   Heart failure Mother    Heart disease Father    Heart disease Brother    Healthy  Brother    Social History Social History   Tobacco Use   Smoking status: Never   Smokeless tobacco: Never  Vaping Use   Vaping Use: Never used  Substance Use Topics   Alcohol use: Yes   Drug use: Never   Allergies   Patient has no known allergies.  Review of Systems Review of Systems Pertinent findings noted in history of present illness.   Physical Exam Triage Vital Signs ED Triage Vitals  Enc Vitals Group     BP 01/29/21 0827 (!) 147/82     Pulse Rate 01/29/21 0827 72     Resp 01/29/21 0827 18     Temp 01/29/21 0827 98.3 F (36.8 C)     Temp Source 01/29/21 0827 Oral     SpO2 01/29/21 0827 98 %     Weight --      Height --      Head Circumference --      Peak Flow --      Pain Score 01/29/21 0826 5     Pain Loc --      Pain Edu? --      Excl. in [place]? --   No data found.  Updated Vital Signs BP 132/78 (BP Location: Right Arm)    Pulse (!) 56    Temp 98.5 F (36.9 C) (Oral)  Resp 18    SpO2 98%   Physical Exam Vitals and nursing note reviewed.  Constitutional:      General: He is not in acute distress.    Appearance: Normal appearance. He is not ill-appearing.  HENT:     Head: Normocephalic and atraumatic.     Salivary Glands: Right salivary gland is not diffusely enlarged or tender. Left salivary gland is not diffusely enlarged or tender.     Right Ear: Ear canal and external ear normal. No drainage. A middle ear effusion (Suppurative) is present. There is no impacted cerumen. Tympanic membrane is injected. Tympanic membrane is not erythematous or bulging.     Left Ear: Tympanic membrane, ear canal and external ear normal. No drainage.  No middle ear effusion. There is no impacted cerumen. Tympanic membrane is not erythematous or bulging.     Ears:     Comments: Patient wearing hearing aids, both ears    Nose: Nose normal. No nasal deformity, septal deviation, mucosal edema, congestion or rhinorrhea.     Right Turbinates: Not enlarged, swollen or pale.      Left Turbinates: Not enlarged, swollen or pale.     Right Sinus: No maxillary sinus tenderness or frontal sinus tenderness.     Left Sinus: No maxillary sinus tenderness or frontal sinus tenderness.     Mouth/Throat:     Lips: Pink. No lesions.     Mouth: Mucous membranes are moist. No oral lesions.     Pharynx: Oropharynx is clear. Uvula midline. No posterior oropharyngeal erythema or uvula swelling.     Tonsils: No tonsillar exudate. 0 on the right. 0 on the left.  Eyes:     General: Lids are normal.        Right eye: No discharge.        Left eye: No discharge.     Extraocular Movements: Extraocular movements intact.     Conjunctiva/sclera: Conjunctivae normal.     Right eye: Right conjunctiva is not injected.     Left eye: Left conjunctiva is not injected.  Neck:     Trachea: Trachea and phonation normal.  Cardiovascular:     Rate and Rhythm: Normal rate and regular rhythm.     Pulses: Normal pulses.     Heart sounds: Normal heart sounds. No murmur heard.   No friction rub. No gallop.  Pulmonary:     Effort: Pulmonary effort is normal. No accessory muscle usage, prolonged expiration or respiratory distress.     Breath sounds: Normal breath sounds. No stridor, decreased air movement or transmitted upper airway sounds. No decreased breath sounds, wheezing, rhonchi or rales.  Chest:     Chest wall: No tenderness.  Musculoskeletal:        General: Normal range of motion.     Cervical back: Normal range of motion and neck supple. Normal range of motion.  Lymphadenopathy:     Cervical: No cervical adenopathy.  Skin:    General: Skin is warm and dry.     Findings: No erythema or rash.  Neurological:     General: No focal deficit present.     Mental Status: He is alert and oriented to person, place, and time.  Psychiatric:        Mood and Affect: Mood normal.        Behavior: Behavior normal.    Visual Acuity Right Eye Distance:   Left Eye Distance:   Bilateral Distance:     Right Eye Near:  Left Eye Near:    Bilateral Near:     UC Couse / Diagnostics / Procedures:    EKG  Radiology No results found.  Procedures Procedures (including critical care time)  UC Diagnoses / Final Clinical Impressions(s)   I have reviewed the triage vital signs and the nursing notes.  Pertinent labs & imaging results that were available during my care of the patient were reviewed by me and considered in my medical decision making (see chart for details).   Final diagnoses:  Acute suppurative otitis media of right ear  Acute bronchitis, unspecified organism   Begin cefdinir for acute suppurative otitis media.  Patient also has some mild bronchitis which cefdinir should help resolve along with regular dosing of ibuprofen.  Return precautions advised.  ED Prescriptions     Medication Sig Dispense Auth. Provider   cefdinir (OMNICEF) 300 MG capsule Take 1 capsule (300 mg total) by mouth 2 (two) times daily for 10 days. 20 capsule Lynden Oxford Scales, PA-C   ibuprofen (ADVIL) 400 MG tablet Take 1 tablet (400 mg total) by mouth every 8 (eight) hours as needed for up to 30 doses. 30 tablet Lynden Oxford Scales, PA-C      PDMP not reviewed this encounter.  Pending results:  Labs Reviewed - No data to display  Medications Ordered in UC: Medications - No data to display  Disposition Upon Discharge:  Condition: stable for discharge home Home: take medications as prescribed; routine discharge instructions as discussed; follow up as advised.  Patient presented with an acute illness with associated systemic symptoms and significant discomfort requiring urgent management. In my opinion, this is a condition that a prudent lay person (someone who possesses an average knowledge of health and medicine) may potentially expect to result in complications if not addressed urgently such as respiratory distress, impairment of bodily function or dysfunction of bodily organs.    Routine symptom specific, illness specific and/or disease specific instructions were discussed with the patient and/or caregiver at length.   As such, the patient has been evaluated and assessed, work-up was performed and treatment was provided in alignment with urgent care protocols and evidence based medicine.  Patient/parent/caregiver has been advised that the patient may require follow up for further testing and treatment if the symptoms continue in spite of treatment, as clinically indicated and appropriate.  If the patient was tested for COVID-19, Influenza and/or RSV, then the patient/parent/guardian was advised to isolate at home pending the results of his/her diagnostic coronavirus test and potentially longer if theyre positive. I have also advised pt that if his/her COVID-19 test returns positive, it's recommended to self-isolate for at least 10 days after symptoms first appeared AND until fever-free for 24 hours without fever reducer AND other symptoms have improved or resolved. Discussed self-isolation recommendations as well as instructions for household member/close contacts as per the [facility] and [facility] DHHS, and also gave patient the [place] packet with this information.  Patient/parent/caregiver has been advised to return to the [facility] or PCP in 3-5 days if no better; to PCP or the Emergency Department if new signs and symptoms develop, or if the current signs or symptoms continue to change or worsen for further workup, evaluation and treatment as clinically indicated and appropriate  The patient will follow up with their current PCP if and as advised. If the patient does not currently have a PCP we will assist them in obtaining one.   The patient may need specialty follow up if the symptoms  continue, in spite of conservative treatment and management, for further workup, evaluation, consultation and treatment as clinically indicated and appropriate.  Patient/parent/caregiver verbalized  understanding and agreement of plan as discussed.  All questions were addressed during visit.  Please see discharge instructions below for further details of plan.  Discharge Instructions:   Discharge Instructions      Please begin cefdinir 1 capsule twice daily for the next 7 days.  Please also continue to use the Ciprodex eardrops in your right ear to provide relief of itching.  Begin ibuprofen 400 mg 3 times daily to reduce inflammation in the lungs.  Thank you for visiting urgent care today.  Please follow-up in the next 3 to 5 days if you are not feeling meaningfully better.    This office note has been dictated using Museum/gallery curator.  Unfortunately, and despite my best efforts, this method of dictation can sometimes lead to occasional typographical or grammatical errors.  I apologize in advance if this occurs.     Lynden Oxford Scales, PA-C 06/14/21 1119

## 2021-06-14 NOTE — ED Triage Notes (Signed)
Pt c/o having a cold, headache, sinus drainage, sore throat and feeling pressure to his chest.   ?

## 2021-06-14 NOTE — Discharge Instructions (Addendum)
Please begin cefdinir 1 capsule twice daily for the next 7 days.  Please also continue to use the Ciprodex eardrops in your right ear to provide relief of itching.  Begin ibuprofen 400 mg 3 times daily to reduce inflammation in the lungs. ? ?Thank you for visiting urgent care today.  Please follow-up in the next 3 to 5 days if you are not feeling meaningfully better. ?

## 2021-06-25 IMAGING — DX DG HAND COMPLETE 3+V*L*
3 series · 3 of 3 positions shown · non-contrast
Comparison: None.

CLINICAL DATA: Pain after fall

EXAM:
LEFT HAND - COMPLETE 3+ VIEW

[hand pa]
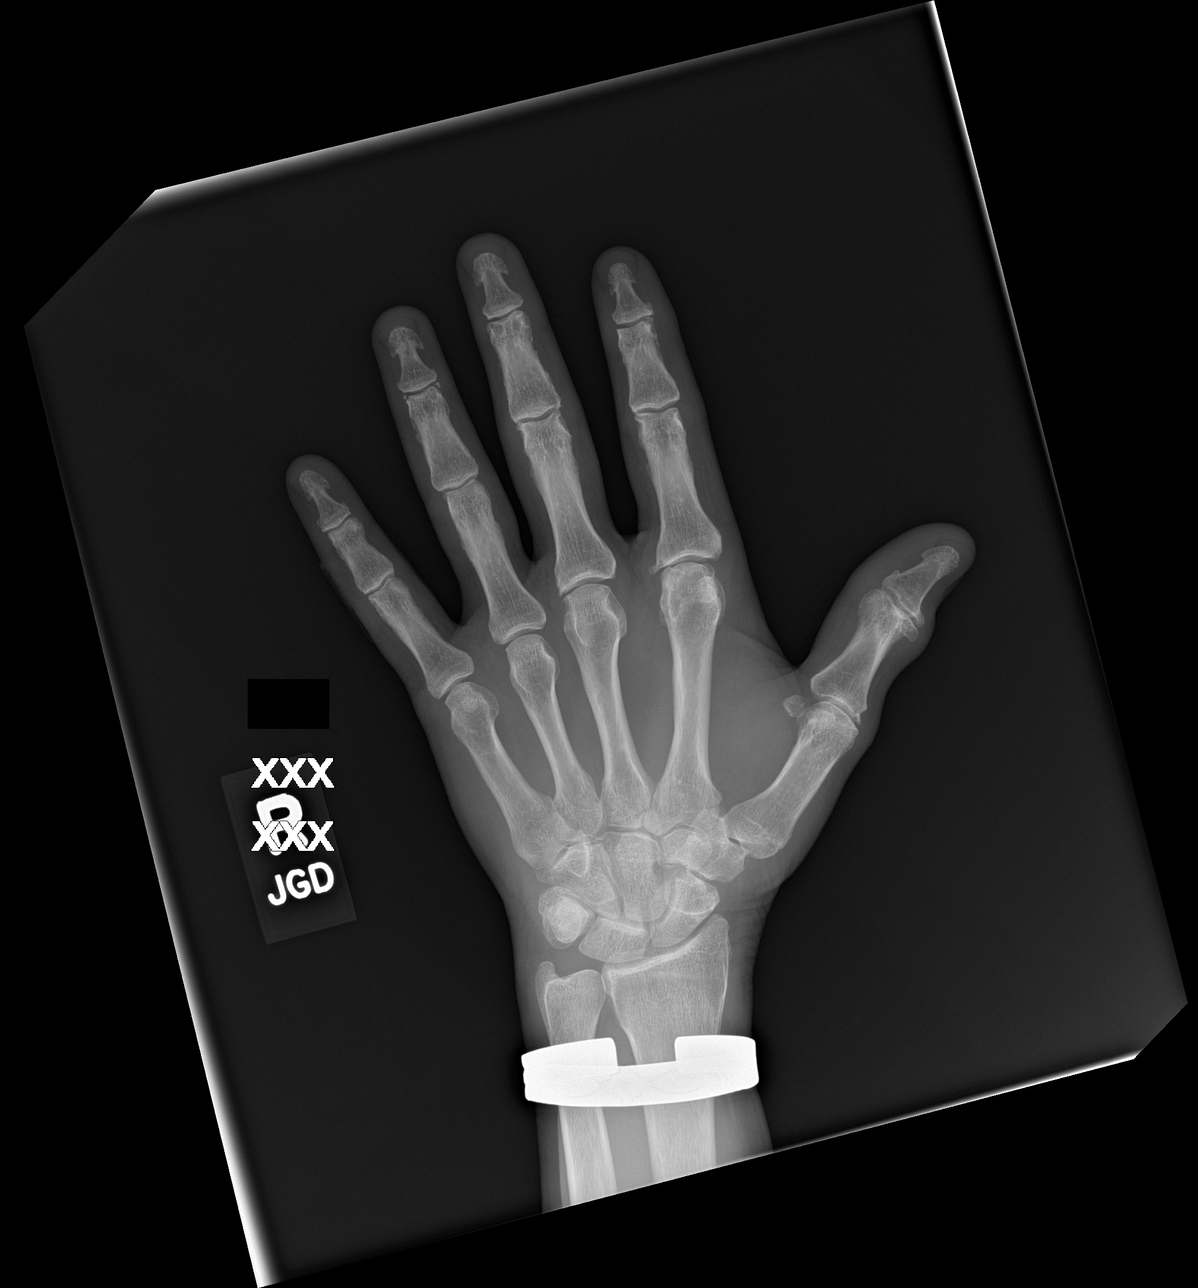

[hand obl]
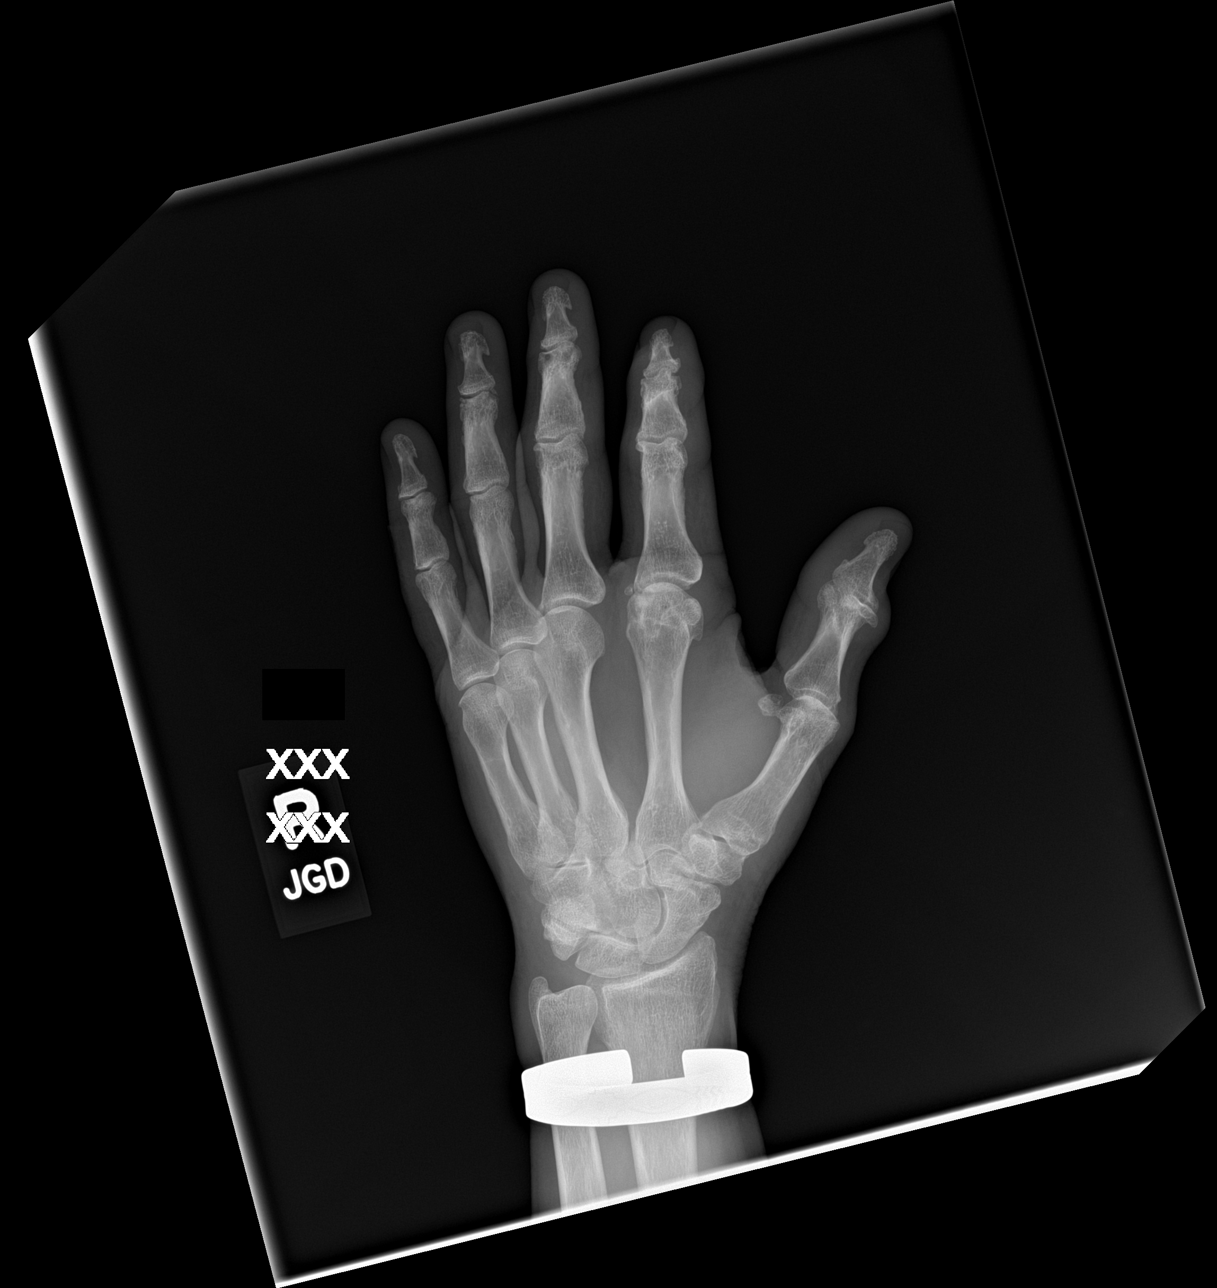

[hand lat]
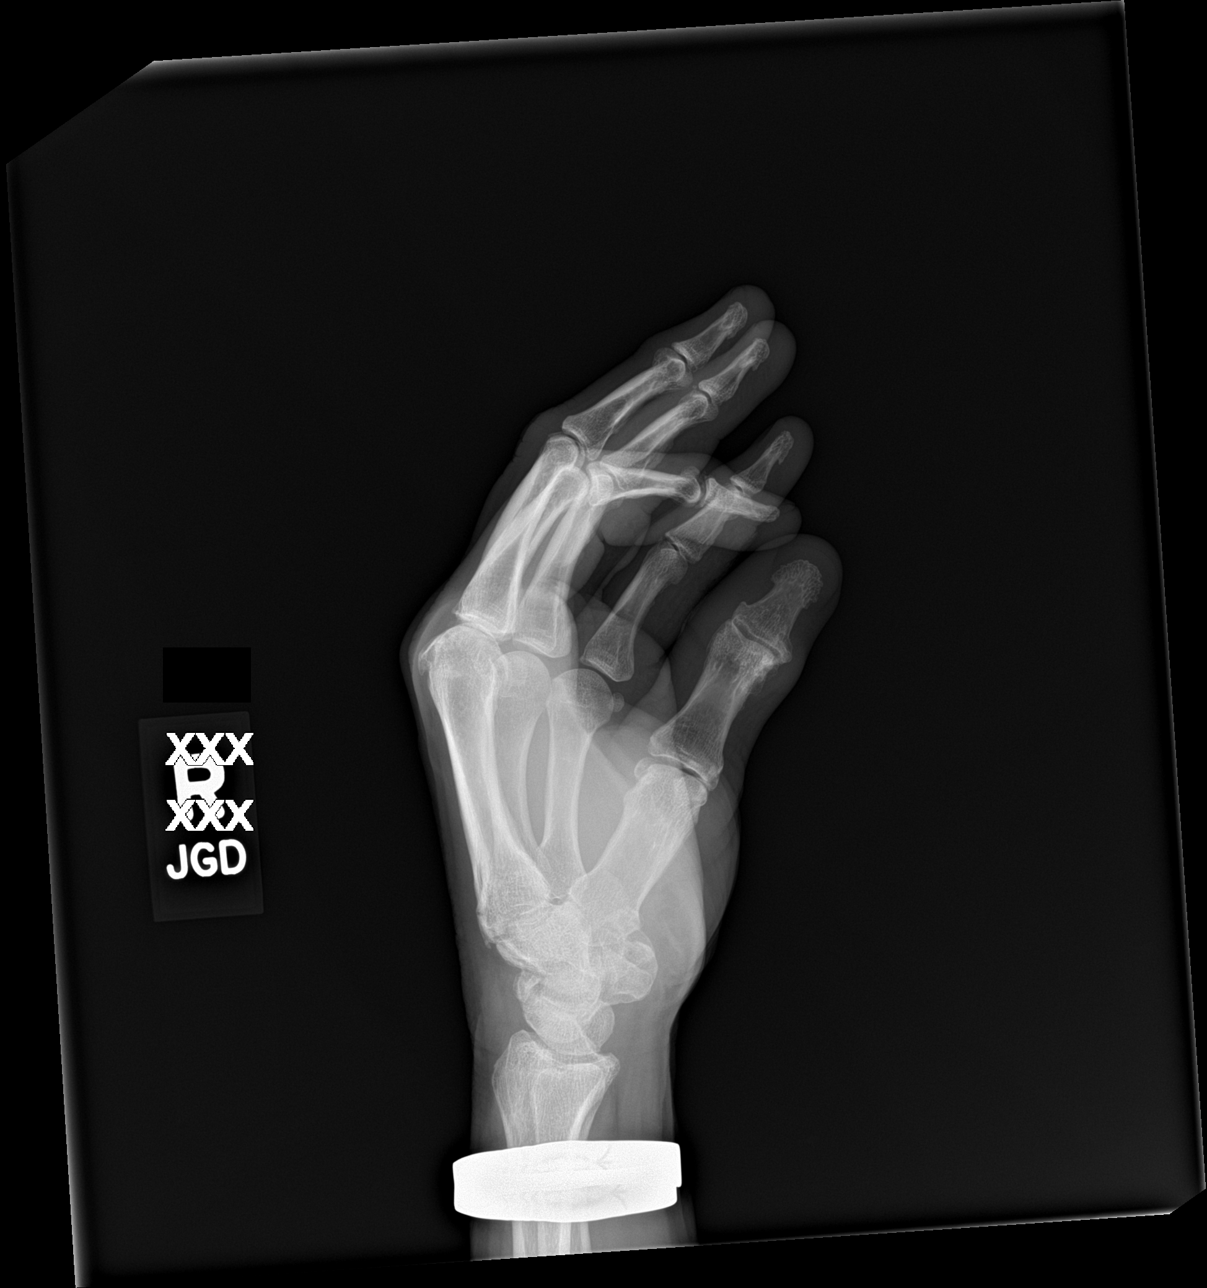

[3 of 3 positions shown; findings below may reference images not displayed]

FINDINGS: Scattered degenerative changes.  No fractures identified.
IMPRESSION: Scattered degenerative changes.  No fractures noted.

## 2021-07-26 ENCOUNTER — Emergency Department (HOSPITAL_COMMUNITY)
Admission: EM | Admit: 2021-07-26 | Discharge: 2021-07-26 | Disposition: A | Discharge status: Home / Self Care | Attending: Emergency Medicine | Admitting: Emergency Medicine

## 2021-07-26 ENCOUNTER — Encounter (HOSPITAL_COMMUNITY): Payer: Self-pay

## 2021-07-26 ENCOUNTER — Emergency Department (HOSPITAL_COMMUNITY)

## 2021-07-26 ENCOUNTER — Other Ambulatory Visit: Payer: Self-pay

## 2021-07-26 DIAGNOSIS — Z7982 Long term (current) use of aspirin: Secondary | ICD-10-CM | POA: Diagnosis not present

## 2021-07-26 DIAGNOSIS — G43809 Other migraine, not intractable, without status migrainosus: Secondary | ICD-10-CM | POA: Insufficient documentation

## 2021-07-26 DIAGNOSIS — R519 Headache, unspecified: Secondary | ICD-10-CM | POA: Diagnosis present

## 2021-07-26 LAB — CBC
HCT: 43.4 % (ref 39.0–52.0)
Hemoglobin: 15.3 g/dL (ref 13.0–17.0)
MCH: 32.2 pg (ref 26.0–34.0)
MCHC: 35.3 g/dL (ref 30.0–36.0)
MCV: 91.4 fL (ref 80.0–100.0)
Platelets: 178 10*3/uL (ref 150–400)
RBC: 4.75 MIL/uL (ref 4.22–5.81)
RDW: 12.6 % (ref 11.5–15.5)
WBC: 7.2 10*3/uL (ref 4.0–10.5)
nRBC: 0 % (ref 0.0–0.2)

## 2021-07-26 LAB — BASIC METABOLIC PANEL
Anion gap: 7 (ref 5–15)
BUN: 23 mg/dL (ref 8–23)
CO2: 26 mmol/L (ref 22–32)
Calcium: 9.2 mg/dL (ref 8.9–10.3)
Chloride: 106 mmol/L (ref 98–111)
Creatinine, Ser: 0.97 mg/dL (ref 0.61–1.24)
GFR, Estimated: >60 mL/min (ref >60)
Glucose, Bld: 94 mg/dL (ref 70–99)
Potassium: 4.1 mmol/L (ref 3.5–5.1)
Sodium: 139 mmol/L (ref 135–145)

## 2021-07-26 MED ORDER — METOCLOPRAMIDE HCL 5 MG/ML IJ SOLN
10.0000 mg | Freq: Once | INTRAMUSCULAR | Status: AC
  Administered 2021-07-26: 10 mg via INTRAVENOUS
  Filled 2021-07-26: qty 2

## 2021-07-26 MED ORDER — KETOROLAC TROMETHAMINE 15 MG/ML IJ SOLN
15.0000 mg | Freq: Once | INTRAMUSCULAR | Status: AC
  Administered 2021-07-26: 15 mg via INTRAVENOUS
  Filled 2021-07-26: qty 1

## 2021-07-26 MED ORDER — ONDANSETRON 8 MG PO TBDP
8.0000 mg | ORAL_TABLET | Freq: Once | ORAL | Status: DC
  Filled 2021-07-26: qty 1

## 2021-07-26 MED ORDER — SODIUM CHLORIDE 0.9 % IV BOLUS
1000.0000 mL | Freq: Once | INTRAVENOUS | Status: AC
  Administered 2021-07-26: 1000 mL via INTRAVENOUS

## 2021-07-26 MED ORDER — DIPHENHYDRAMINE HCL 50 MG/ML IJ SOLN
12.5000 mg | Freq: Once | INTRAMUSCULAR | Status: AC
  Administered 2021-07-26: 12.5 mg via INTRAVENOUS
  Filled 2021-07-26: qty 1

## 2021-07-26 NOTE — ED Provider Notes (Signed)
?[facility] DEPT ?Provider Note ? ? ?CSN: [id number] ?Arrival date & time: 07/26/21  1300 ? ?  ? ?History ? ?Chief Complaint  ?Patient presents with  ? Headache  ? ? ?Steven Glenn is a 66 y.o. male. ? ?Pt is a 67 yo male presenting for headache. Pt admits sudden headache, frontal in location, described as stabbing, severe 10/10 pain severity that started at 10 AM this morning. Pt denies any motor or sensation deficits with headache. Able to ambulate without difficulty. Had associated nausea that is now resolved. Pt arrived to ED approx 1PM and seen by triage process. No medications given at this time. Headache improving to 6/10 pain severity.  ? ?The history is provided by the patient. No language interpreter was used.  ?Headache ?Associated symptoms: no abdominal pain, no back pain, no cough, no ear pain, no eye pain, no fever, no seizures, no sore throat and no vomiting   ? ?  ? ?Home Medications ?Prior to Admission medications   ?Medication Sig Start Date End Date Taking? Authorizing Provider  ?aspirin 81 MG chewable tablet Chew by mouth daily.    [provider]  ?Clobetasol Prop Emollient Base (CLOBETASOL PROPIONATE E) 0.05 % emollient cream Apply to affected area- Do not use on face, folds or groin 03/15/21   Lavonna Monarch, MD  ?ibuprofen (ADVIL) 400 MG tablet Take 1 tablet (400 mg total) by mouth every 8 (eight) hours as needed for up to 30 doses. 06/14/21   Lynden Oxford Scales, PA-C  ?Loratadine (CLARITIN PO) Take by mouth.    [provider]  ?rosuvastatin (CRESTOR) 20 MG tablet Take 1 tablet (20 mg total) by mouth daily. 07/06/20   Elouise Munroe, MD  ?   ? ?Allergies    ?Patient has no known allergies.   ? ?Review of Systems   ?Review of Systems  ?Constitutional:  Negative for chills and fever.  ?HENT:  Negative for ear pain and sore throat.   ?Eyes:  Negative for pain and visual disturbance.  ?Respiratory:  Negative for cough and shortness of  breath.   ?Cardiovascular:  Negative for chest pain and palpitations.  ?Gastrointestinal:  Negative for abdominal pain and vomiting.  ?Genitourinary:  Negative for dysuria and hematuria.  ?Musculoskeletal:  Negative for arthralgias and back pain.  ?Skin:  Negative for color change and rash.  ?Neurological:  Positive for headaches. Negative for seizures and syncope.  ?All other systems reviewed and are negative. ? ?Physical Exam ?Updated Vital Signs ?BP 103/75   Pulse (!) 103   Temp (!) 97.5 ?F (36.4 ?C) (Oral)   Resp 17   Ht '5\' 10"'$  (1.778 m)   Wt 78 kg   SpO2 97%   BMI 24.68 kg/m?  ?Physical Exam ?Vitals and nursing note reviewed.  ?Constitutional:   ?   General: He is not in acute distress. ?   Appearance: He is well-developed.  ?HENT:  ?   Head: Normocephalic and atraumatic.  ?Eyes:  ?   General: Lids are normal.  ?   Conjunctiva/sclera: Conjunctivae normal.  ?   Pupils: Pupils are equal, round, and reactive to light.  ?Cardiovascular:  ?   Rate and Rhythm: Normal rate and regular rhythm.  ?   Heart sounds: No murmur heard. ?Pulmonary:  ?   Effort: Pulmonary effort is normal. No respiratory distress.  ?   Breath sounds: Normal breath sounds.  ?Abdominal:  ?   Palpations: Abdomen is soft.  ?  Tenderness: There is no abdominal tenderness.  ?Musculoskeletal:     ?   General: No swelling.  ?   Cervical back: Neck supple.  ?Skin: ?   General: Skin is warm and dry.  ?   Capillary Refill: Capillary refill takes less than 2 seconds.  ?Neurological:  ?   General: No focal deficit present.  ?   Mental Status: He is alert and oriented to person, place, and time.  ?   GCS: GCS eye subscore is 4. GCS verbal subscore is 5. GCS motor subscore is 6.  ?   Cranial Nerves: Cranial nerves 2-12 are intact.  ?   Sensory: Sensation is intact.  ?   Motor: Motor function is intact.  ?   Coordination: Coordination is intact.  ?   Gait: Gait is intact.  ?Psychiatric:     ?   Mood and Affect: Mood normal.  ? ? ?ED Results /  Procedures / Treatments   ?Labs ?(all labs ordered are listed, but only abnormal results are displayed) ?Labs Reviewed  ?CBC  ?BASIC METABOLIC PANEL  ? ? ?EKG ?None ? ?Radiology ?CT Head Wo Contrast ? ?Result Date: 07/26/2021 ?CLINICAL DATA:  Sudden onset of severe headaches EXAM: CT HEAD WITHOUT CONTRAST TECHNIQUE: Contiguous axial images were obtained from the base of the skull through the vertex without intravenous contrast. RADIATION DOSE REDUCTION: This exam was performed according to the departmental dose-optimization program which includes automated exposure control, adjustment of the mA and/or kV according to patient size and/or use of iterative reconstruction technique. COMPARISON:  None. FINDINGS: Brain: No acute intracranial findings are seen. There are no signs of bleeding. Ventricles are not dilated. There is no shift of midline structures. There is no focal edema or mass effect. Cortical sulci are prominent. Vascular: Unremarkable. Skull: Unremarkable. Sinuses/Orbits: There is mild mucosal thickening in the ethmoid sinus. Other: None IMPRESSION: No acute intracranial findings are seen in noncontrast CT brain. Atrophy. Electronically Signed   By: Elmer Picker M.D.   On: 07/26/2021 14:11   ? ?Procedures ?Marland KitchenCritical Care ?Performed by: Lianne Cure, DO ?Authorized by: Lianne Cure, DO  ? ?Critical care provider statement:  ?  Critical care time (minutes):  30 ?  Critical care was necessary to treat or prevent imminent or life-threatening deterioration of the following conditions: concerns for subarachnoid hemorrhage. ?  Critical care was time spent personally by me on the following activities:  Development of treatment plan with patient or surrogate, discussions with consultants, evaluation of patient's response to treatment, examination of patient, ordering and review of laboratory studies, ordering and review of radiographic studies, ordering and performing treatments and interventions, pulse  oximetry, re-evaluation of patient's condition and review of old charts  ? ? ?Medications Ordered in ED ?Medications  ?ondansetron (ZOFRAN-ODT) disintegrating tablet 8 mg (8 mg Oral Patient Refused/Not Given 07/26/21 1703)  ?sodium chloride 0.9 % bolus 1,000 mL (has no administration in time range)  ?ketorolac (TORADOL) 15 MG/ML injection 15 mg (has no administration in time range)  ?metoCLOPramide (REGLAN) injection 10 mg (has no administration in time range)  ?diphenhydrAMINE (BENADRYL) injection 12.5 mg (has no administration in time range)  ? ? ?ED Course/ Medical Decision Making/ A&P ?  ?                        ?Medical Decision Making ?Risk ?Prescription drug management. ? ? ?5:08 PM ?Patient is a 66 yo male presenting for headache.  Patient is alert and oriented x3, no acute distress, afebrile, stable vital signs.  Physical exam demonstrates no neurovascular deficits.  Low suspicion for CVA.  Patient presented stating " I have the worst headache of my life".  CT head read by triage process demonstrates no acute process.  No cranial hemorrhage seen on imaging.  ? ?IV fluids, Toradol, Reglan, Benadryl given for symptomatic management. ? ?Patient in no distress and overall condition improved here in the ED. Detailed discussions were had with the patient regarding current findings, and need for close f/u with PCP or neurology if symptoms continue. The patient has been instructed to return immediately if the symptoms worsen in any way for re-evaluation. Patient verbalized understanding and is in agreement with current care plan. All questions answered prior to discharge. ? ? ? ? ? ? ? ? ?Final Clinical Impression(s) / ED Diagnoses ?Final diagnoses:  ?Other migraine without status migrainosus, not intractable  ? ? ?Rx / DC Orders ?ED Discharge Orders   ? ? None  ? ?  ? ? ?  ?Lianne Cure, DO ?93/73/42 1746 ? ?

## 2021-07-26 NOTE — ED Provider Triage Note (Signed)
Emergency Medicine Provider Triage Evaluation Note ? ?Steven Glenn , a 66 y.o. male  was evaluated in triage.  Pt complains of worse headache of life with acute onset this morning.  Patient also endorsing slight nausea.  Patient states that on examination his headache is worsened.  He denies any chest pain, shortness of breath, blurred vision. ? ?Review of Systems  ?Positive:  ?Negative:  ? ?Physical Exam  ?BP 117/67 (BP Location: Left Arm)   Pulse (!) 57   Temp 97.6 ?F (36.4 ?C) (Oral)   Resp 16   Ht '5\' 10"'$  (1.778 m)   Wt 78 kg   SpO2 98%   BMI 24.68 kg/m?  ?Gen:   Awake, no distress   ?Resp:  Normal effort  ?MSK:   Moves extremities without difficulty  ?Other:  No focal neurodeficits on examination.  Patient has 5 and 5 strength bilaterally upper and lower extremities. ? ?Medical Decision Making  ?Medically screening exam initiated at 2:48 PM.  Appropriate orders placed.  Steven Glenn was informed that the remainder of the evaluation will be completed by another provider, this initial triage assessment does not replace that evaluation, and the importance of remaining in the ED until their evaluation is complete. ? ? ?  ?Azucena Cecil, PA-C ?07/26/21 1449 ? ?

## 2021-07-26 NOTE — ED Triage Notes (Signed)
Patient  reports that he had a sudden headache that began at 1000 today. Patient states the pain has been 10/10 but has lessened to an 8/10 at this time.  Patient states that he has never had a headache like this before. ?Patient is not taking any blood thinners at this time. ? ?Patient denies any SOB, dizziness or blurred vision. ? ?

## 2021-07-26 NOTE — Discharge Instructions (Addendum)
Please return to emergency department if headache returns with the increased severity or for any neurological dysfunction including dizziness, difficulty walking, numbness or motor deficits in the upper or lower extremities. ?

## 2021-08-25 ENCOUNTER — Ambulatory Visit (INDEPENDENT_AMBULATORY_CARE_PROVIDER_SITE_OTHER): Payer: Medicare Other | Admitting: Internal Medicine

## 2021-08-25 ENCOUNTER — Encounter: Payer: Self-pay | Admitting: Internal Medicine

## 2021-08-25 VITALS — BP 126/78 | HR 54 | Ht 71.0 in | Wt 177.6 lb

## 2021-08-25 DIAGNOSIS — I4510 Unspecified right bundle-branch block: Secondary | ICD-10-CM

## 2021-08-25 DIAGNOSIS — I2510 Atherosclerotic heart disease of native coronary artery without angina pectoris: Secondary | ICD-10-CM | POA: Diagnosis not present

## 2021-08-25 DIAGNOSIS — I340 Nonrheumatic mitral (valve) insufficiency: Secondary | ICD-10-CM | POA: Diagnosis not present

## 2021-08-25 DIAGNOSIS — Z79899 Other long term (current) drug therapy: Secondary | ICD-10-CM

## 2021-08-25 DIAGNOSIS — E785 Hyperlipidemia, unspecified: Secondary | ICD-10-CM | POA: Diagnosis not present

## 2021-08-25 MED ORDER — ROSUVASTATIN CALCIUM 20 MG PO TABS: 20.0000 mg | ORAL_TABLET | Freq: Every day | ORAL | 3 refills | Status: DC

## 2021-08-25 NOTE — Progress Notes (Signed)
Cardiology Office Note:    Date:  08/25/2021   ID:  Steven Glenn, DOB 07-09-1955, MRN [id number]  PCP:  [facility]  Cardiologist:  Elouise Munroe, MD  Electrophysiologist:  None   Referring MD: Janith Lima, MD   Chief Complaint/Reason for Referral: CAD  History of Present Illness:    Steven Glenn is a 66 y.o. male with a history of CAD, melanoma, elevated LFTs, carpal tunnel right hand.   2014- >90% blockage LAD, stent - Resolute stent. Plavix for 2 years, then aspirin alone.   Crestor 20 mg, well tolerated. Got heartburn with Lipitor.  Snores, not screened for sleep apnea.   The patient denies chest pain, chest pressure, dyspnea at rest or with exertion, palpitations, PND, orthopnea, or leg swelling. Denies cough, fever, chills. Denies nausea, vomiting. Denies syncope or presyncope. Denies dizziness or lightheadedness.   Past Medical History:  Diagnosis Date   Allergy    Atypical mole 2021   left proximal post thigh lentiginous compound nevus Associated dermatology   Chest pain     Past Surgical History:  Procedure Laterality Date   CAROTID STENT     carpel tunnel      Current Medications: Current Meds  Medication Sig   aspirin 81 MG chewable tablet Chew by mouth daily.   Clobetasol Prop Emollient Base (CLOBETASOL PROPIONATE E) 0.05 % emollient cream Apply to affected area- Do not use on face, folds or groin   ibuprofen (ADVIL) 400 MG tablet Take 1 tablet (400 mg total) by mouth every 8 (eight) hours as needed for up to 30 doses.   Loratadine (CLARITIN PO) Take by mouth.   [DISCONTINUED] rosuvastatin (CRESTOR) 20 MG tablet Take 1 tablet (20 mg total) by mouth daily.     Allergies:   Patient has no known allergies.   Social History   Tobacco Use   Smoking status: Never   Smokeless tobacco: Never  Vaping Use   Vaping Use: Never used  Substance Use Topics   Alcohol use: Yes   Drug use: Never     Family History: The  patient's family history includes Healthy in his brother; Heart disease in his brother and father; Heart failure in his mother.  ROS:   Please see the history of present illness.    All other systems reviewed and are negative.  EKGs/Labs/Other Studies Reviewed:    The following studies were reviewed today:  EKG:  Sinus bradycardia, RBBB, 158 ms   Recent Labs: 07/26/2021: BUN 23; Creatinine, Ser 0.97; Hemoglobin 15.3; Platelets 178; Potassium 4.1; Sodium 139  Recent Lipid Panel    Component Value Date/Time   CHOL 116 06/08/2020 1508   TRIG 65.0 06/08/2020 1508   HDL 66.30 06/08/2020 1508   CHOLHDL 2 06/08/2020 1508   VLDL 13.0 06/08/2020 1508   LDLCALC 37 06/08/2020 1508    Physical Exam:    VS:  BP 126/78   Pulse (!) 54   Ht '5\' 11"'$  (1.803 m)   Wt 177 lb 9.6 oz (80.6 kg)   SpO2 98%   BMI 24.77 kg/m     Wt Readings from Last 5 Encounters:  08/25/21 177 lb 9.6 oz (80.6 kg)  07/26/21 172 lb (78 kg)  07/06/20 178 lb 6.4 oz (80.9 kg)  06/08/20 175 lb (79.4 kg)    Constitutional: No acute distress Eyes: sclera non-icteric, normal conjunctiva and lids ENMT: normal dentition, moist mucous membranes Cardiovascular: regular rhythm, bradycardic rate, no murmurs. S1 and  S2 normal. Radial pulses normal bilaterally. No jugular venous distention.  Respiratory: clear to auscultation bilaterally GI : normal bowel sounds, soft and nontender. No distention.   MSK: extremities warm, well perfused. No edema.  NEURO: grossly nonfocal exam, moves all extremities. PSYCH: alert and oriented x 3, normal mood and affect.   ASSESSMENT:    1. Mitral valve insufficiency, unspecified etiology   2. Coronary artery disease involving native coronary artery of native heart without angina pectoris   3. Hyperlipidemia LDL goal <70   4. RBBB    PLAN:    Coronary artery disease involving native coronary artery of native heart without angina pectoris - Plan: EKG 12-Lead -Clinically stable without  angina.  Continue aspirin 81 mg daily and rosuvastatin 20 mg daily.  Crestor refilled today.  Hyperlipidemia LDL goal <70 - lipids well controlled on crestor, continue.   Right bundle branch block -QRS duration 158 ms.  Clinically asymptomatic with excellent exercise tolerance.  Red flag symptoms of conduction system disease reviewed today.  Mitral valve regurgitation -Mild to moderate on my review of echocardiogram last year.  We will repeat an echo this year.  Mitral valve noted to be grossly normal, suspect mild degenerative mitral valve disease as etiology, will review again on echo this year.  Total time of encounter: 30 minutes total time of encounter, including 15 minutes spent in face-to-face patient care on the date of this encounter. This time includes coordination of care and counseling regarding above mentioned problem list. Remainder of non-face-to-face time involved reviewing chart documents/testing relevant to the patient encounter and documentation in the medical record. I have independently reviewed documentation from referring provider.   Cherlynn Kaiser, MD, [facility]     Medication Adjustments/Labs and Tests Ordered: Current medicines are reviewed at length with the patient today.  Concerns regarding medicines are outlined above.   Orders Placed This Encounter  Procedures   EKG 12-Lead   ECHOCARDIOGRAM COMPLETE    Meds ordered this encounter  Medications   rosuvastatin (CRESTOR) 20 MG tablet    Sig: Take 1 tablet (20 mg total) by mouth daily.    Dispense:  90 tablet    Refill:  3    Patient Instructions  Medication Instructions:  No Changes In Medications at this time.  *If you need a refill on your cardiac medications before your next appointment, please call your pharmacy*  Testing/Procedures: Your physician has requested that you have an echocardiogram. Echocardiography is a painless test that uses sound waves to create images  of your heart. It provides your doctor with information about the size and shape of your heart and how well your heart's chambers and valves are working. You may receive an ultrasound enhancing agent through an IV if needed to better visualize your heart during the echo.This procedure takes approximately one hour. There are no restrictions for this procedure. This will take place at the [address], [address].   Follow-Up: At [facility], you and your health needs are our priority.  As part of our continuing mission to provide you with exceptional heart care, we have created designated Provider Care Teams.  These Care Teams include your primary Cardiologist (physician) and Advanced Practice Providers (APPs -  Physician Assistants and Nurse Practitioners) who all work together to provide you with the care you need, when you need it.  Your next appointment:   1 year(s)  The format for your next appointment:   In Person  Provider:   Elouise Munroe, MD

## 2021-08-25 NOTE — Patient Instructions (Signed)
Medication Instructions:  No Changes In Medications at this time.  *If you need a refill on your cardiac medications before your next appointment, please call your pharmacy*  Testing/Procedures: Your physician has requested that you have an echocardiogram. Echocardiography is a painless test that uses sound waves to create images of your heart. It provides your doctor with information about the size and shape of your heart and how well your heart's chambers and valves are working. You may receive an ultrasound enhancing agent through an IV if needed to better visualize your heart during the echo.This procedure takes approximately one hour. There are no restrictions for this procedure. This will take place at the [address], [address].   Follow-Up: At [facility], you and your health needs are our priority.  As part of our continuing mission to provide you with exceptional heart care, we have created designated Provider Care Teams.  These Care Teams include your primary Cardiologist (physician) and Advanced Practice Providers (APPs -  Physician Assistants and Nurse Practitioners) who all work together to provide you with the care you need, when you need it.  Your next appointment:   1 year(s)  The format for your next appointment:   In Person  Provider:   Elouise Munroe, MD

## 2021-10-11 ENCOUNTER — Ambulatory Visit (HOSPITAL_COMMUNITY): Payer: Medicare Other | Attending: Cardiology

## 2021-10-11 DIAGNOSIS — I340 Nonrheumatic mitral (valve) insufficiency: Secondary | ICD-10-CM | POA: Diagnosis present

## 2021-10-13 LAB — ECHOCARDIOGRAM COMPLETE
Area-P 1/2: 3.21 cm2
P 1/2 time: 799 msec
S' Lateral: 2.8 cm

## 2021-10-20 ENCOUNTER — Encounter: Payer: Self-pay | Admitting: Internal Medicine

## 2021-12-13 ENCOUNTER — Observation Stay (HOSPITAL_COMMUNITY)
Admission: EM | Admit: 2021-12-13 | Discharge: 2021-12-15 | Disposition: A | Discharge status: Home / Self Care | Payer: Medicare Other | Attending: Internal Medicine | Admitting: Internal Medicine

## 2021-12-13 ENCOUNTER — Emergency Department (HOSPITAL_COMMUNITY): Payer: Medicare Other

## 2021-12-13 ENCOUNTER — Encounter (HOSPITAL_COMMUNITY): Payer: Self-pay

## 2021-12-13 ENCOUNTER — Other Ambulatory Visit: Payer: Self-pay

## 2021-12-13 DIAGNOSIS — Z7982 Long term (current) use of aspirin: Secondary | ICD-10-CM | POA: Insufficient documentation

## 2021-12-13 DIAGNOSIS — I25110 Atherosclerotic heart disease of native coronary artery with unstable angina pectoris: Principal | ICD-10-CM | POA: Insufficient documentation

## 2021-12-13 DIAGNOSIS — Z79899 Other long term (current) drug therapy: Secondary | ICD-10-CM | POA: Diagnosis not present

## 2021-12-13 DIAGNOSIS — I2510 Atherosclerotic heart disease of native coronary artery without angina pectoris: Secondary | ICD-10-CM | POA: Diagnosis present

## 2021-12-13 DIAGNOSIS — R079 Chest pain, unspecified: Secondary | ICD-10-CM | POA: Diagnosis present

## 2021-12-13 LAB — CBC
HCT: 42.3 % (ref 39.0–52.0)
Hemoglobin: 14.8 g/dL (ref 13.0–17.0)
MCH: 32.4 pg (ref 26.0–34.0)
MCHC: 35 g/dL (ref 30.0–36.0)
MCV: 92.6 fL (ref 80.0–100.0)
Platelets: 172 10*3/uL (ref 150–400)
RBC: 4.57 MIL/uL (ref 4.22–5.81)
RDW: 12.5 % (ref 11.5–15.5)
WBC: 5.5 10*3/uL (ref 4.0–10.5)
nRBC: 0 % (ref 0.0–0.2)

## 2021-12-13 LAB — BASIC METABOLIC PANEL
Anion gap: 6 (ref 5–15)
BUN: 19 mg/dL (ref 8–23)
CO2: 24 mmol/L (ref 22–32)
Calcium: 9.1 mg/dL (ref 8.9–10.3)
Chloride: 110 mmol/L (ref 98–111)
Creatinine, Ser: 1 mg/dL (ref 0.61–1.24)
GFR, Estimated: >60 mL/min (ref >60)
Glucose, Bld: 103 mg/dL — ABNORMAL HIGH (ref 70–99)
Potassium: 3.8 mmol/L (ref 3.5–5.1)
Sodium: 140 mmol/L (ref 135–145)

## 2021-12-13 LAB — TROPONIN I (HIGH SENSITIVITY): Troponin I (High Sensitivity): 4 ng/L (ref <18)

## 2021-12-13 NOTE — ED Triage Notes (Signed)
Pt complaining of intermittent cp, shob, and dizziness that started today. Pt describes pain as burning, fullness, and someone sitting on chest.  Stent placed 9 years ago.  Pain worse with laying down Hx GERD  Took ASA and Pepcid with no relief.

## 2021-12-13 NOTE — ED Provider Triage Note (Signed)
Emergency Medicine Provider Triage Evaluation Note  Steven Glenn , a 66 y.o. male  was evaluated in triage.  Pt complains of a "angina attack."  Reports he was running around with his dog earlier and started to have acute onset midline chest pain.  Did not radiate.  Says that he originally thought it might be reflux and the Pepcid which did not help him.  Says that it got better for a little bit but the pain never went away.  Now says it is gradually worsening.  History of ACS with a stent placement 9 years ago.  Also says he has chest pressure which feels like a band across his chest.  Review of Systems  Positive: Chest pain, intermittent shortness of breath Negative: Palpitations  Physical Exam  BP (!) 145/75 (BP Location: Left Arm)   Pulse (!) 54   Temp 98 F (36.7 C) (Oral)   Resp 20   Ht '5\' 11"'$  (1.803 m)   Wt 80 kg   SpO2 100%   BMI 24.60 kg/m  Gen:   Awake, no distress  Resp:  Normal effort  MSK:   Moves extremities without difficulty  Other:  Bradycardia, regular rhythm, well-appearing, reproducible inferior sternal tenderness  Medical Decision Making  Medically screening exam initiated at 9:22 PM.  Appropriate orders placed.  Steven Glenn was informed that the remainder of the evaluation will be completed by another provider, this initial triage assessment does not replace that evaluation, and the importance of remaining in the ED until their evaluation is complete.     Rhae Hammock, PA-C 12/13/21 2123

## 2021-12-14 ENCOUNTER — Encounter (HOSPITAL_COMMUNITY): Payer: Self-pay | Admitting: Student

## 2021-12-14 ENCOUNTER — Encounter (HOSPITAL_COMMUNITY)
Admission: EM | Disposition: A | Discharge status: Home / Self Care | Payer: Self-pay | Source: Home / Self Care | Attending: Emergency Medicine

## 2021-12-14 DIAGNOSIS — R079 Chest pain, unspecified: Secondary | ICD-10-CM | POA: Diagnosis present

## 2021-12-14 DIAGNOSIS — I2583 Coronary atherosclerosis due to lipid rich plaque: Secondary | ICD-10-CM

## 2021-12-14 DIAGNOSIS — I2510 Atherosclerotic heart disease of native coronary artery without angina pectoris: Secondary | ICD-10-CM | POA: Diagnosis not present

## 2021-12-14 DIAGNOSIS — Z7982 Long term (current) use of aspirin: Secondary | ICD-10-CM | POA: Diagnosis not present

## 2021-12-14 DIAGNOSIS — I200 Unstable angina: Secondary | ICD-10-CM | POA: Diagnosis not present

## 2021-12-14 DIAGNOSIS — E7800 Pure hypercholesterolemia, unspecified: Secondary | ICD-10-CM

## 2021-12-14 DIAGNOSIS — I25110 Atherosclerotic heart disease of native coronary artery with unstable angina pectoris: Secondary | ICD-10-CM | POA: Diagnosis not present

## 2021-12-14 DIAGNOSIS — Z79899 Other long term (current) drug therapy: Secondary | ICD-10-CM | POA: Diagnosis not present

## 2021-12-14 HISTORY — PX: LEFT HEART CATH AND CORONARY ANGIOGRAPHY: CATH118249

## 2021-12-14 LAB — TROPONIN I (HIGH SENSITIVITY): Troponin I (High Sensitivity): 3 ng/L (ref <18)

## 2021-12-14 SURGERY — LEFT HEART CATH AND CORONARY ANGIOGRAPHY
Anesthesia: LOCAL

## 2021-12-14 MED ORDER — SODIUM CHLORIDE 0.9% FLUSH: 3.0000 mL | INTRAVENOUS | Status: DC | PRN

## 2021-12-14 MED ORDER — KCL IN DEXTROSE-NACL 10-5-0.45 MEQ/L-%-% IV SOLN
INTRAVENOUS | Status: AC
  Filled 2021-12-14: qty 1000

## 2021-12-14 MED ORDER — IOHEXOL 350 MG/ML SOLN
INTRAVENOUS | Status: DC | PRN
  Administered 2021-12-14: 50 mL

## 2021-12-14 MED ORDER — NITROGLYCERIN 0.4 MG SL SUBL
0.4000 mg | SUBLINGUAL_TABLET | Freq: Once | SUBLINGUAL | Status: AC
  Administered 2021-12-14: 0.4 mg via SUBLINGUAL
  Filled 2021-12-14: qty 1

## 2021-12-14 MED ORDER — ROSUVASTATIN CALCIUM 20 MG PO TABS
20.0000 mg | ORAL_TABLET | Freq: Every day | ORAL | Status: DC
  Administered 2021-12-14 – 2021-12-15 (×2): 20 mg via ORAL
  Filled 2021-12-14 (×2): qty 1

## 2021-12-14 MED ORDER — HEPARIN BOLUS VIA INFUSION
4000.0000 [IU] | Freq: Once | INTRAVENOUS | Status: AC
  Administered 2021-12-14: 4000 [IU] via INTRAVENOUS
  Filled 2021-12-14: qty 4000

## 2021-12-14 MED ORDER — HEPARIN (PORCINE) IN NACL 1000-0.9 UT/500ML-% IV SOLN
INTRAVENOUS | Status: AC
  Filled 2021-12-14: qty 500

## 2021-12-14 MED ORDER — HEPARIN (PORCINE) 25000 UT/250ML-% IV SOLN
950.0000 [IU]/h | INTRAVENOUS | Status: DC
  Administered 2021-12-14: 950 [IU]/h via INTRAVENOUS
  Filled 2021-12-14: qty 250

## 2021-12-14 MED ORDER — LIDOCAINE HCL (PF) 1 % IJ SOLN
INTRAMUSCULAR | Status: AC
  Filled 2021-12-14: qty 30

## 2021-12-14 MED ORDER — SODIUM CHLORIDE 0.9 % IV SOLN: 250.0000 mL | INTRAVENOUS | Status: DC | PRN

## 2021-12-14 MED ORDER — LIDOCAINE HCL (PF) 1 % IJ SOLN
INTRAMUSCULAR | Status: DC | PRN
  Administered 2021-12-14: 2 mL

## 2021-12-14 MED ORDER — SODIUM CHLORIDE 0.9 % WEIGHT BASED INFUSION: 1.0000 mL/kg/h | INTRAVENOUS | Status: DC

## 2021-12-14 MED ORDER — CALCIUM CARBONATE ANTACID 500 MG PO CHEW
400.0000 mg | CHEWABLE_TABLET | ORAL | Status: AC
  Administered 2021-12-14: 400 mg via ORAL
  Filled 2021-12-14: qty 2

## 2021-12-14 MED ORDER — FENTANYL CITRATE (PF) 100 MCG/2ML IJ SOLN
INTRAMUSCULAR | Status: DC | PRN
  Administered 2021-12-14: 25 ug via INTRAVENOUS

## 2021-12-14 MED ORDER — SODIUM CHLORIDE 0.9 % WEIGHT BASED INFUSION: 3.0000 mL/kg/h | INTRAVENOUS | Status: DC

## 2021-12-14 MED ORDER — ENOXAPARIN SODIUM 40 MG/0.4ML IJ SOSY
40.0000 mg | PREFILLED_SYRINGE | INTRAMUSCULAR | Status: DC
  Administered 2021-12-14: 40 mg via SUBCUTANEOUS
  Filled 2021-12-14: qty 0.4

## 2021-12-14 MED ORDER — VERAPAMIL HCL 2.5 MG/ML IV SOLN
INTRAVENOUS | Status: DC | PRN
  Administered 2021-12-14: 10 mL via INTRA_ARTERIAL

## 2021-12-14 MED ORDER — MIDAZOLAM HCL 2 MG/2ML IJ SOLN
INTRAMUSCULAR | Status: AC
  Filled 2021-12-14: qty 2

## 2021-12-14 MED ORDER — NITROGLYCERIN 0.4 MG SL SUBL: 0.4000 mg | SUBLINGUAL_TABLET | SUBLINGUAL | Status: DC | PRN

## 2021-12-14 MED ORDER — ACETAMINOPHEN 325 MG PO TABS: 650.0000 mg | ORAL_TABLET | ORAL | Status: DC | PRN

## 2021-12-14 MED ORDER — VERAPAMIL HCL 2.5 MG/ML IV SOLN
INTRAVENOUS | Status: AC
  Filled 2021-12-14: qty 2

## 2021-12-14 MED ORDER — SODIUM CHLORIDE 0.9 % IV SOLN: INTRAVENOUS | Status: AC

## 2021-12-14 MED ORDER — HEPARIN SODIUM (PORCINE) 1000 UNIT/ML IJ SOLN
INTRAMUSCULAR | Status: AC
  Filled 2021-12-14: qty 10

## 2021-12-14 MED ORDER — SODIUM CHLORIDE 0.9% FLUSH
3.0000 mL | Freq: Two times a day (BID) | INTRAVENOUS | Status: DC
  Administered 2021-12-14 – 2021-12-15 (×3): 3 mL via INTRAVENOUS

## 2021-12-14 MED ORDER — FAMOTIDINE 20 MG PO TABS
10.0000 mg | ORAL_TABLET | Freq: Every day | ORAL | Status: DC
  Administered 2021-12-14 – 2021-12-15 (×2): 10 mg via ORAL
  Filled 2021-12-14 (×3): qty 1

## 2021-12-14 MED ORDER — FENTANYL CITRATE (PF) 100 MCG/2ML IJ SOLN
INTRAMUSCULAR | Status: AC
  Filled 2021-12-14: qty 2

## 2021-12-14 MED ORDER — ASPIRIN 81 MG PO CHEW
324.0000 mg | CHEWABLE_TABLET | Freq: Once | ORAL | Status: AC
  Administered 2021-12-14: 324 mg via ORAL
  Filled 2021-12-14: qty 4

## 2021-12-14 MED ORDER — HEPARIN SODIUM (PORCINE) 1000 UNIT/ML IJ SOLN
INTRAMUSCULAR | Status: DC | PRN
  Administered 2021-12-14: 4000 [IU] via INTRAVENOUS

## 2021-12-14 MED ORDER — MIDAZOLAM HCL 2 MG/2ML IJ SOLN
INTRAMUSCULAR | Status: DC | PRN
  Administered 2021-12-14 (×2): 1 mg via INTRAVENOUS

## 2021-12-14 MED ORDER — HEPARIN (PORCINE) IN NACL 1000-0.9 UT/500ML-% IV SOLN
INTRAVENOUS | Status: DC | PRN
  Administered 2021-12-14 (×2): 500 mL

## 2021-12-14 MED ORDER — ONDANSETRON HCL 4 MG/2ML IJ SOLN: 4.0000 mg | Freq: Four times a day (QID) | INTRAMUSCULAR | Status: DC | PRN

## 2021-12-14 MED ORDER — ASPIRIN 81 MG PO CHEW
81.0000 mg | CHEWABLE_TABLET | Freq: Every day | ORAL | Status: DC
  Administered 2021-12-14 – 2021-12-15 (×2): 81 mg via ORAL
  Filled 2021-12-14 (×2): qty 1

## 2021-12-14 SURGICAL SUPPLY — 11 items
CATH OPTITORQUE TIG 4.0 5F (CATHETERS) IMPLANT
DEVICE RAD COMP TR BAND LRG (VASCULAR PRODUCTS) IMPLANT
ELECT DEFIB PAD ADLT CADENCE (PAD) IMPLANT
GLIDESHEATH SLEND SS 6F .021 (SHEATH) IMPLANT
GUIDEWIRE INQWIRE 1.5J.035X260 (WIRE) IMPLANT
INQWIRE 1.5J .035X260CM (WIRE) ×1
KIT HEART LEFT (KITS) ×1 IMPLANT
PACK CARDIAC CATHETERIZATION (CUSTOM PROCEDURE TRAY) ×1 IMPLANT
SHEATH PROBE COVER 6X72 (BAG) IMPLANT
TRANSDUCER W/STOPCOCK (MISCELLANEOUS) ×1 IMPLANT
TUBING CIL FLEX 10 FLL-RA (TUBING) ×1 IMPLANT

## 2021-12-14 NOTE — ED Notes (Signed)
Report given to [facility] Cath Lab

## 2021-12-14 NOTE — Plan of Care (Signed)
  Problem: Cardiac: Goal: Ability to achieve and maintain adequate cardiovascular perfusion will improve Outcome: Progressing   Problem: Health Behavior/Discharge Planning: Goal: Ability to safely manage health-related needs after discharge will improve Outcome: Progressing   Problem: Education: Goal: Understanding of CV disease, CV risk reduction, and recovery process will improve Outcome: Progressing Goal: Individualized Educational Video(s) Outcome: Progressing   Problem: Cardiovascular: Goal: Ability to achieve and maintain adequate cardiovascular perfusion will improve Outcome: Progressing Goal: Vascular access site(s) Level 0-1 will be maintained Outcome: Progressing   Problem: Pain Managment: Goal: General experience of comfort will improve Outcome: Progressing   Problem: Safety: Goal: Ability to remain free from injury will improve Outcome: Progressing

## 2021-12-14 NOTE — Consult Note (Signed)
Cardiology Consultation   Patient ID: Steven Glenn MRN: [id number]; DOB: 22-Mar-1956  Admit date: 12/13/2021 Date of Consult: 12/14/2021  PCP:  [facility] Cardiologist:  Elouise Munroe, MD     Patient Profile:   Steven Glenn is a 66 y.o. male with a hx of CAD with LAD stent placed in 2014, melanoma, RBBB who is being seen 12/14/2021 for the evaluation of chest pain at the request of Dr. Dwyane Dee.  History of Present Illness:   Steven Glenn is a 66 year old male with above medical history who is followed by Dr. Margaretann Loveless.  Per chart review, patient underwent heart catheterization in 2014 that showed a 90% blockage in the LAD.  A DES was placed and he was treated with DAPT with ASA/Plavix for 2 years followed by aspirin alone.  He also was treated with Crestor, had heartburn with Lipitor.  He established care with Dr. Margaretann Loveless in 07/2020.  Echocardiogram on 08/19/2020 showed EF 64%, no regional wall motion abnormalities, mild LVH, normal RV systolic function, mild-moderate mitral valve regurgitation, mild aortic valve regurgitation.  He was last seen by cardiology on 08/25/2021.  At that time, patient was doing well and denied any chest pain, chest pressure, dyspnea, palpitations.  Cardiogram on 10/11/2021 showed EF 60-65%, no regional wall motion abnormalities, normal LV diastolic parameters, normal RV systolic function, mild mitral valve regurgitation, mild aortic valve regurgitation.  Presented to the ED on 9/11 complaining of intermittent chest pain, shortness of breath, dizziness that started that day.  Pain felt like burning, fullness, pressure.  Associated with shortness of breath, diaphoresis. Labs in the ED showed Na 140, K 3.8, creatinine 1.00, WBC 5.5, hemoglobin 14.8, platelets 172. hsTn 4>>3. CXR showed no active cardiopulmonary disease.   Patient tells me that he has been fine from a cardiac standpoint ever since his PCI  in 2014 with no further cardiac problems.  He says that yesterday he started developing intermittent chest pain and shortness of breath associated with diaphoresis.  It felt like a burning sensation but also a pressure and heaviness in the midsternal area that would radiate across his chest.  It was much worse with any type of physical exertion.  He could not walk the dog because the pain would come on.  He said that he tried to lay down yesterday and the pain was significantly improved but with any exertional activity the pain would come back.  When he was brought to the emergency room he was given sublingual nitroglycerin that he thinks helped some but did not completely relieve it.  He continues to have chest pain.  He also tried to take a PPI yesterday as he thought it could be indigestion and had no improvement in his symptoms.  He tells me this discomfort is exactly what he had prior to his PCI and has never had this sensation since his PCI until yesterday.  Past Medical History:  Diagnosis Date   Allergy    Atypical mole 2021   left proximal post thigh lentiginous compound nevus Associated dermatology   Chest pain     Past Surgical History:  Procedure Laterality Date   CAROTID STENT     carpel tunnel       Home Medications:  Prior to Admission medications   Medication Sig Start Date End Date Taking? Authorizing Provider  aspirin 81 MG chewable tablet Chew 81 mg by mouth daily.   Yes [provider]  Famotidine (PEPCID AC PO) Take 1 tablet by mouth daily.   Yes [provider]  ibuprofen (ADVIL) 400 MG tablet Take 1 tablet (400 mg total) by mouth every 8 (eight) hours as needed for up to 30 doses. 06/14/21  Yes Lynden Oxford Scales, PA-C  rosuvastatin (CRESTOR) 20 MG tablet Take 1 tablet (20 mg total) by mouth daily. 08/25/21  Yes Elouise Munroe, MD  Clobetasol Prop Emollient Base (CLOBETASOL PROPIONATE E) 0.05 % emollient cream Apply to affected area- Do not use on  face, folds or groin Patient not taking: Reported on 12/14/2021 03/15/21   Lavonna Monarch, MD    Inpatient Medications: Scheduled Meds:  aspirin  81 mg Oral Daily   enoxaparin (LOVENOX) injection  40 mg Subcutaneous Q24H   famotidine  10 mg Oral Daily   rosuvastatin  20 mg Oral Daily   sodium chloride flush  3 mL Intravenous Q12H   Continuous Infusions:  sodium chloride     sodium chloride     Followed by   sodium chloride     dextrose 5 % and 0.45 % NaCl with KCl 10 mEq/L 75 mL/hr at 12/14/21 0612   PRN Meds: sodium chloride, acetaminophen, nitroGLYCERIN, ondansetron (ZOFRAN) IV, sodium chloride flush  Allergies:    Allergies  Allergen Reactions   Pollen Extract Cough    Social History:   Social History   Socioeconomic History   Marital status: Married    Spouse name: Not on file   Number of children: Not on file   Years of education: Not on file   Highest education level: Not on file  Occupational History   Not on file  Tobacco Use   Smoking status: Never   Smokeless tobacco: Never  Vaping Use   Vaping Use: Never used  Substance and Sexual Activity   Alcohol use: Yes   Drug use: Never   Sexual activity: Not on file  Other Topics Concern   Not on file  Social History Narrative   Not on file   Social Determinants of Health   Financial Resource Strain: Not on file  Food Insecurity: Not on file  Transportation Needs: Not on file  Physical Activity: Not on file  Stress: Not on file  Social Connections: Not on file  Intimate Partner Violence: Not on file    Family History:    Family History  Problem Relation Age of Onset   Heart failure Mother    Heart disease Father    Heart disease Brother    Healthy Brother      ROS:  Please see the history of present illness.   All other ROS reviewed and negative.     Physical Exam/Data:   Vitals:   12/14/21 0820 12/14/21 1115 12/14/21 1200 12/14/21 1209  BP: 103/66 116/84 116/69   Pulse: (!) 46 (!) 53  (!) 50   Resp: '16 17 17   '$ Temp: (!) 97.2 F (36.2 C)   (!) 97.2 F (36.2 C)  TempSrc: Oral   Oral  SpO2: 99% 100% 98%   Weight:      Height:       No intake or output data in the 24 hours ending 12/14/21 1253    12/13/2021    8:55 PM 08/25/2021    1:04 PM 07/26/2021    1:50 PM  Last 3 Weights  Weight (lbs) 176 lb 5.9 oz 177 lb 9.6 oz 172 lb  Weight (kg) 80 kg 80.559 kg 78.019 kg  Body mass index is 24.6 kg/m.  General:  Well nourished, well developed, in no acute distress HEENT: normal Neck: no JVD Vascular: No carotid bruits; Distal pulses 2+ bilaterally Cardiac:  normal S1, S2; RRR; no murmur  Lungs:  clear to auscultation bilaterally, no wheezing, rhonchi or rales  Abd: soft, nontender, no hepatomegaly  Ext: no edema Musculoskeletal:  No deformities, BUE and BLE strength normal and equal Skin: warm and dry  Neuro:  CNs 2-12 intact, no focal abnormalities noted Psych:  Normal affect   EKG:  The EKG was personally reviewed and demonstrates: Normal sinus rhythm with no ST changes Telemetry:  Telemetry was personally reviewed and demonstrates: Normal sinus rhythm  Relevant CV Studies: 2D echo 10/11/2021 IMPRESSIONS    1. Left ventricular ejection fraction, by estimation, is 60 to 65%. The  left ventricle has normal function. The left ventricle has no regional  wall motion abnormalities. Left ventricular diastolic parameters were  normal. The average left ventricular  global longitudinal strain is -23.5 %. The global longitudinal strain is  normal.   2. Right ventricular systolic function is normal. The right ventricular  size is normal.   3. The mitral valve is grossly normal. Mild mitral valve regurgitation.  No evidence of mitral stenosis.   4. The aortic valve is tricuspid. Aortic valve regurgitation is mild. No  aortic stenosis is present.   5. The inferior vena cava is normal in size with greater than 50%  respiratory variability, suggesting right atrial  pressure of 3 mmHg.   Comparison(s): Prior images reviewed side by side. Mitral valve  regurgitation not as prominent as prior study.   Conclusion(s)/Recommendation(s): Otherwise normal echocardiogram, with  minor abnormalities described in the report.   Laboratory Data:  High Sensitivity Troponin:   Recent Labs  Lab 12/13/21 2131 12/13/21 2257  TROPONINIHS 4 3     Chemistry Recent Labs  Lab 12/13/21 2131  NA 140  K 3.8  CL 110  CO2 24  GLUCOSE 103*  BUN 19  CREATININE 1.00  CALCIUM 9.1  GFRNONAA >60  ANIONGAP 6    No results for input(s): "PROT", "ALBUMIN", "AST", "ALT", "ALKPHOS", "BILITOT" in the last 168 hours. Lipids No results for input(s): "CHOL", "TRIG", "HDL", "LABVLDL", "LDLCALC", "CHOLHDL" in the last 168 hours.  Hematology Recent Labs  Lab 12/13/21 2131  WBC 5.5  RBC 4.57  HGB 14.8  HCT 42.3  MCV 92.6  MCH 32.4  MCHC 35.0  RDW 12.5  PLT 172   Thyroid No results for input(s): "TSH", "FREET4" in the last 168 hours.  BNPNo results for input(s): "BNP", "PROBNP" in the last 168 hours.  DDimer No results for input(s): "DDIMER" in the last 168 hours.   Radiology/Studies:  DG Chest 2 View  Result Date: 12/13/2021 CLINICAL DATA:  Chest pain, shortness of breath EXAM: CHEST - 2 VIEW COMPARISON:  None Available. FINDINGS: The heart size and mediastinal contours are within normal limits. Both lungs are clear. The visualized skeletal structures are unremarkable. IMPRESSION: No active cardiopulmonary disease. Electronically Signed   By: Rolm Baptise M.D.   On: 12/13/2021 21:21     Assessment and Plan:   Unstable Angina  -Patient presented complaining of intermittent chest pressure/fullness that had been intermittent. -Pain improved some with rest and much worse with any exertional activity and associated with diaphoresis and shortness of breath -No improvement with PPI and slightly improved with sublingual nitrates -hsTn negative x2 in the ED and EKG  nonischemic -Pain identical to  prior PCI in 06/26/12 which she has not had since his PCI -Suspect the patient has unstable/crescendo angina I would recommend proceeding with left heart catheterization and possible PCI -Shared Decision Making/Informed Consent The risks [stroke (1 in 1000), death (1 in 26-Jun-998), kidney failure [usually temporary] (1 in 500), bleeding (1 in 200), allergic reaction [possibly serious] (1 in 200)], benefits (diagnostic support and management of coronary artery disease) and alternatives of a cardiac catheterization were discussed in detail with Steven Glenn and he is willing to proceed.  -Check 2D echo  ASCAD -Status post remote PCI in 06/26/12 -He has been completely asymptomatic up until a day ago -Continue aspirin 81 mg daily, Crestor 20 mg daily -Cannot use beta-blocker due to resting bradycardia -Start IV heparin drip  Hyperlipidemia -LDL goal less than 70 -Check FLP and ALT in the a.m. -Continue Crestor 20 mg daily   Risk Assessment/Risk Scores:     TIMI Risk Score for Unstable Angina or Non-ST Elevation MI:   The patient's TIMI risk score is 4, which indicates a 20% risk of all cause mortality, new or recurrent myocardial infarction or need for urgent revascularization in the next 14 days.          For questions or updates, please contact [place] Please consult [email or web address] for contact info under    Signed, Fransico Him, MD  12/14/2021 12:53 PM

## 2021-12-14 NOTE — Care Plan (Signed)
This 66 years old male with PMH significant for CAD with LAD stent placed in 2014 presented in the ED with C/O: chest pain.  Patient describes chest pain as heaviness and getting worse with exertion while running and playing with his dog.  Chest pain resolves with rest and get worse again with activity. In the ED EKG shows first-degree AV block, RBBB, chest x-ray negative for acute infiltrate.  Troponin x3 negative.  Patient was given sublingual nitro.  Cardiology is consulted.  Patient continues to have ongoing chest pain.  Cardiology recommended transfer to [facility] for possible left heart cath today.

## 2021-12-14 NOTE — H&P (View-Only) (Signed)
Cardiology Consultation   Patient ID: Steven Glenn MRN: [id number]; DOB: 25-Jan-1956  Admit date: 12/13/2021 Date of Consult: 12/14/2021  PCP:  [facility] [place] [facility] Cardiologist:  Elouise Munroe, MD     Patient Profile:   Steven Glenn is a 66 y.o. male with a hx of CAD with LAD stent placed in 2014, melanoma, RBBB who is being seen 12/14/2021 for the evaluation of chest pain at the request of Dr. Dwyane Dee.  History of Present Illness:   Steven Glenn is a 66 year old male with above medical history who is followed by Dr. Margaretann Loveless.  Per chart review, patient underwent heart catheterization in 2014 that showed a 90% blockage in the LAD.  A DES was placed and he was treated with DAPT with ASA/Plavix for 2 years followed by aspirin alone.  He also was treated with Crestor, had heartburn with Lipitor.  He established care with Dr. Margaretann Loveless in 07/2020.  Echocardiogram on 08/19/2020 showed EF 64%, no regional wall motion abnormalities, mild LVH, normal RV systolic function, mild-moderate mitral valve regurgitation, mild aortic valve regurgitation.  He was last seen by cardiology on 08/25/2021.  At that time, patient was doing well and denied any chest pain, chest pressure, dyspnea, palpitations.  Cardiogram on 10/11/2021 showed EF 60-65%, no regional wall motion abnormalities, normal LV diastolic parameters, normal RV systolic function, mild mitral valve regurgitation, mild aortic valve regurgitation.  Presented to the ED on 9/11 complaining of intermittent chest pain, shortness of breath, dizziness that started that day.  Pain felt like burning, fullness, pressure.  Associated with shortness of breath, diaphoresis. Labs in the ED showed Na 140, K 3.8, creatinine 1.00, WBC 5.5, hemoglobin 14.8, platelets 172. hsTn 4>>3. CXR showed no active cardiopulmonary disease.   Patient tells me that he has been fine from a cardiac standpoint ever since his PCI  in 2014 with no further cardiac problems.  He says that yesterday he started developing intermittent chest pain and shortness of breath associated with diaphoresis.  It felt like a burning sensation but also a pressure and heaviness in the midsternal area that would radiate across his chest.  It was much worse with any type of physical exertion.  He could not walk the dog because the pain would come on.  He said that he tried to lay down yesterday and the pain was significantly improved but with any exertional activity the pain would come back.  When he was brought to the emergency room he was given sublingual nitroglycerin that he thinks helped some but did not completely relieve it.  He continues to have chest pain.  He also tried to take a PPI yesterday as he thought it could be indigestion and had no improvement in his symptoms.  He tells me this discomfort is exactly what he had prior to his PCI and has never had this sensation since his PCI until yesterday.  Past Medical History:  Diagnosis Date   Allergy    Atypical mole 2021   left proximal post thigh lentiginous compound nevus Associated dermatology   Chest pain     Past Surgical History:  Procedure Laterality Date   CAROTID STENT     carpel tunnel       Home Medications:  Prior to Admission medications   Medication Sig Start Date End Date Taking? Authorizing Provider  aspirin 81 MG chewable tablet Chew 81 mg by mouth daily.   Yes [provider]  Famotidine (PEPCID AC PO) Take 1 tablet by mouth daily.   Yes [provider]  ibuprofen (ADVIL) 400 MG tablet Take 1 tablet (400 mg total) by mouth every 8 (eight) hours as needed for up to 30 doses. 06/14/21  Yes Lynden Oxford Scales, PA-C  rosuvastatin (CRESTOR) 20 MG tablet Take 1 tablet (20 mg total) by mouth daily. 08/25/21  Yes Elouise Munroe, MD  Clobetasol Prop Emollient Base (CLOBETASOL PROPIONATE E) 0.05 % emollient cream Apply to affected area- Do not use on  face, folds or groin Patient not taking: Reported on 12/14/2021 03/15/21   Lavonna Monarch, MD    Inpatient Medications: Scheduled Meds:  aspirin  81 mg Oral Daily   enoxaparin (LOVENOX) injection  40 mg Subcutaneous Q24H   famotidine  10 mg Oral Daily   rosuvastatin  20 mg Oral Daily   sodium chloride flush  3 mL Intravenous Q12H   Continuous Infusions:  sodium chloride     sodium chloride     Followed by   sodium chloride     dextrose 5 % and 0.45 % NaCl with KCl 10 mEq/L 75 mL/hr at 12/14/21 0612   PRN Meds: sodium chloride, acetaminophen, nitroGLYCERIN, ondansetron (ZOFRAN) IV, sodium chloride flush  Allergies:    Allergies  Allergen Reactions   Pollen Extract Cough    Social History:   Social History   Socioeconomic History   Marital status: Married    Spouse name: Not on file   Number of children: Not on file   Years of education: Not on file   Highest education level: Not on file  Occupational History   Not on file  Tobacco Use   Smoking status: Never   Smokeless tobacco: Never  Vaping Use   Vaping Use: Never used  Substance and Sexual Activity   Alcohol use: Yes   Drug use: Never   Sexual activity: Not on file  Other Topics Concern   Not on file  Social History Narrative   Not on file   Social Determinants of Health   Financial Resource Strain: Not on file  Food Insecurity: Not on file  Transportation Needs: Not on file  Physical Activity: Not on file  Stress: Not on file  Social Connections: Not on file  Intimate Partner Violence: Not on file    Family History:    Family History  Problem Relation Age of Onset   Heart failure Mother    Heart disease Father    Heart disease Brother    Healthy Brother      ROS:  Please see the history of present illness.   All other ROS reviewed and negative.     Physical Exam/Data:   Vitals:   12/14/21 0820 12/14/21 1115 12/14/21 1200 12/14/21 1209  BP: 103/66 116/84 116/69   Pulse: (!) 46 (!) 53  (!) 50   Resp: '16 17 17   '$ Temp: (!) 97.2 F (36.2 C)   (!) 97.2 F (36.2 C)  TempSrc: Oral   Oral  SpO2: 99% 100% 98%   Weight:      Height:       No intake or output data in the 24 hours ending 12/14/21 1253    12/13/2021    8:55 PM 08/25/2021    1:04 PM 07/26/2021    1:50 PM  Last 3 Weights  Weight (lbs) 176 lb 5.9 oz 177 lb 9.6 oz 172 lb  Weight (kg) 80 kg 80.559 kg 78.019 kg  Body mass index is 24.6 kg/m.  General:  Well nourished, well developed, in no acute distress HEENT: normal Neck: no JVD Vascular: No carotid bruits; Distal pulses 2+ bilaterally Cardiac:  normal S1, S2; RRR; no murmur  Lungs:  clear to auscultation bilaterally, no wheezing, rhonchi or rales  Abd: soft, nontender, no hepatomegaly  Ext: no edema Musculoskeletal:  No deformities, BUE and BLE strength normal and equal Skin: warm and dry  Neuro:  CNs 2-12 intact, no focal abnormalities noted Psych:  Normal affect   EKG:  The EKG was personally reviewed and demonstrates: Normal sinus rhythm with no ST changes Telemetry:  Telemetry was personally reviewed and demonstrates: Normal sinus rhythm  Relevant CV Studies: 2D echo 10/11/2021 IMPRESSIONS    1. Left ventricular ejection fraction, by estimation, is 60 to 65%. The  left ventricle has normal function. The left ventricle has no regional  wall motion abnormalities. Left ventricular diastolic parameters were  normal. The average left ventricular  global longitudinal strain is -23.5 %. The global longitudinal strain is  normal.   2. Right ventricular systolic function is normal. The right ventricular  size is normal.   3. The mitral valve is grossly normal. Mild mitral valve regurgitation.  No evidence of mitral stenosis.   4. The aortic valve is tricuspid. Aortic valve regurgitation is mild. No  aortic stenosis is present.   5. The inferior vena cava is normal in size with greater than 50%  respiratory variability, suggesting right atrial  pressure of 3 mmHg.   Comparison(s): Prior images reviewed side by side. Mitral valve  regurgitation not as prominent as prior study.   Conclusion(s)/Recommendation(s): Otherwise normal echocardiogram, with  minor abnormalities described in the report.   Laboratory Data:  High Sensitivity Troponin:   Recent Labs  Lab 12/13/21 2131 12/13/21 2257  TROPONINIHS 4 3     Chemistry Recent Labs  Lab 12/13/21 2131  NA 140  K 3.8  CL 110  CO2 24  GLUCOSE 103*  BUN 19  CREATININE 1.00  CALCIUM 9.1  GFRNONAA >60  ANIONGAP 6    No results for input(s): "PROT", "ALBUMIN", "AST", "ALT", "ALKPHOS", "BILITOT" in the last 168 hours. Lipids No results for input(s): "CHOL", "TRIG", "HDL", "LABVLDL", "LDLCALC", "CHOLHDL" in the last 168 hours.  Hematology Recent Labs  Lab 12/13/21 2131  WBC 5.5  RBC 4.57  HGB 14.8  HCT 42.3  MCV 92.6  MCH 32.4  MCHC 35.0  RDW 12.5  PLT 172   Thyroid No results for input(s): "TSH", "FREET4" in the last 168 hours.  BNPNo results for input(s): "BNP", "PROBNP" in the last 168 hours.  DDimer No results for input(s): "DDIMER" in the last 168 hours.   Radiology/Studies:  DG Chest 2 View  Result Date: 12/13/2021 CLINICAL DATA:  Chest pain, shortness of breath EXAM: CHEST - 2 VIEW COMPARISON:  None Available. FINDINGS: The heart size and mediastinal contours are within normal limits. Both lungs are clear. The visualized skeletal structures are unremarkable. IMPRESSION: No active cardiopulmonary disease. Electronically Signed   By: Rolm Baptise M.D.   On: 12/13/2021 21:21     Assessment and Plan:   Unstable Angina  -Patient presented complaining of intermittent chest pressure/fullness that had been intermittent. -Pain improved some with rest and much worse with any exertional activity and associated with diaphoresis and shortness of breath -No improvement with PPI and slightly improved with sublingual nitrates -hsTn negative x2 in the ED and EKG  nonischemic -Pain identical to  prior PCI in 06-22-12 which she has not had since his PCI -Suspect the patient has unstable/crescendo angina I would recommend proceeding with left heart catheterization and possible PCI -Shared Decision Making/Informed Consent The risks [stroke (1 in 1000), death (1 in 998-06-22), kidney failure [usually temporary] (1 in 500), bleeding (1 in 200), allergic reaction [possibly serious] (1 in 200)], benefits (diagnostic support and management of coronary artery disease) and alternatives of a cardiac catheterization were discussed in detail with Mr. Germano and he is willing to proceed.  -Check 2D echo  ASCAD -Status post remote PCI in Jun 22, 2012 -He has been completely asymptomatic up until a day ago -Continue aspirin 81 mg daily, Crestor 20 mg daily -Cannot use beta-blocker due to resting bradycardia -Start IV heparin drip  Hyperlipidemia -LDL goal less than 70 -Check FLP and ALT in the a.m. -Continue Crestor 20 mg daily   Risk Assessment/Risk Scores:     TIMI Risk Score for Unstable Angina or Non-ST Elevation MI:   The patient's TIMI risk score is 4, which indicates a 20% risk of all cause mortality, new or recurrent myocardial infarction or need for urgent revascularization in the next 14 days.          For questions or updates, please contact [place] Please consult [email or web address] for contact info under    Signed, Fransico Him, MD  12/14/2021 12:53 PM

## 2021-12-14 NOTE — ED Provider Notes (Signed)
Steven Glenn Provider Note   CSN: [id number] Arrival date & time: 12/13/21  2042     History  Chief Complaint  Patient presents with   Chest Pain    Steven Glenn is a 66 y.o. male with a hx of CAD, hyperlipidemia, and prior melanoma who presents to the emergency department with complaints of chest pain that began earlier this morning.  Patient states he was running around playing with his dog when he developed what he describes as "angina", states he developed central nonradiating chest pain described as a pressure/heaviness/fullness with some associated shortness of breath and diaphoresis.  It has waxed and waned throughout the day today.  Worsens with exertion like when he walks the dog tonight, alleviated some with rest.  Did try some Pepcid without much change as he did have some burning discomfort at 1 point.  Current pain is a 2 out of 10 in severity.  He denies nausea, vomiting, syncope, leg pain/swelling.  He states he had somewhat similar pain prior to his stent being put in.  Stent to LAD while he was in [place].  Follows with [facility].  HPI     Home Medications Prior to Admission medications   Medication Sig Start Date End Date Taking? Authorizing Provider  aspirin 81 MG chewable tablet Chew by mouth daily.    [provider]  Clobetasol Prop Emollient Base (CLOBETASOL PROPIONATE E) 0.05 % emollient cream Apply to affected area- Do not use on face, folds or groin 03/15/21   Lavonna Monarch, MD  ibuprofen (ADVIL) 400 MG tablet Take 1 tablet (400 mg total) by mouth every 8 (eight) hours as needed for up to 30 doses. 06/14/21   Lynden Oxford Scales, PA-C  Loratadine (CLARITIN PO) Take by mouth.    [provider]  rosuvastatin (CRESTOR) 20 MG tablet Take 1 tablet (20 mg total) by mouth daily. 08/25/21   Elouise Munroe, MD      Allergies    Pollen extract    Review of Systems   Review of Systems  Constitutional:   Positive for diaphoresis. Negative for chills and fever.  Respiratory:  Positive for shortness of breath.   Cardiovascular:  Positive for chest pain. Negative for leg swelling.  Gastrointestinal:  Negative for abdominal pain, nausea and vomiting.  Neurological:  Negative for syncope.  All other systems reviewed and are negative.   Physical Exam Updated Vital Signs BP (!) 145/75 (BP Location: Left Arm)   Pulse (!) 54   Temp 98 F (36.7 C) (Oral)   Resp 20   Ht '5\' 11"'$  (1.803 m)   Wt 80 kg   SpO2 100%   BMI 24.60 kg/m  Physical Exam Vitals and nursing note reviewed.  Constitutional:      General: He is not in acute distress.    Appearance: He is well-developed. He is not toxic-appearing.  HENT:     Head: Normocephalic and atraumatic.  Eyes:     General:        Right eye: No discharge.        Left eye: No discharge.     Conjunctiva/sclera: Conjunctivae normal.  Cardiovascular:     Rate and Rhythm: Regular rhythm. Bradycardia present.     Pulses:          Radial pulses are 2+ on the right side and 2+ on the left side.  Pulmonary:     Effort: No respiratory distress.     Breath sounds:  Normal breath sounds. No wheezing or rales.  Abdominal:     General: There is no distension.     Palpations: Abdomen is soft.     Tenderness: There is no abdominal tenderness.  Musculoskeletal:     Cervical back: Neck supple.     Right lower leg: No tenderness. No edema.     Left lower leg: No tenderness. No edema.  Skin:    General: Skin is warm and dry.  Neurological:     Mental Status: He is alert.     Comments: Clear speech.   Psychiatric:        Behavior: Behavior normal.     ED Results / Procedures / Treatments   Labs (all labs ordered are listed, but only abnormal results are displayed) Labs Reviewed  BASIC METABOLIC PANEL - Abnormal; Notable for the following components:      Result Value   Glucose, Bld 103 (*)    All other components within normal limits  CBC   TROPONIN I (HIGH SENSITIVITY)  TROPONIN I (HIGH SENSITIVITY)    EKG EKG Interpretation  Date/Time:  Monday December 13 2021 20:53:36 EDT Ventricular Rate:  52 PR Interval:  215 QRS Duration: 160 QT Interval:  437 QTC Calculation: 407 R Axis:   90 Text Interpretation: Sinus rhythm Borderline prolonged PR interval RBBB and LPFB No previous tracing Confirmed by Orpah Greek [phone]) on 12/14/2021 3:49:28 AM  Radiology DG Chest 2 View  Result Date: 12/13/2021 CLINICAL DATA:  Chest pain, shortness of breath EXAM: CHEST - 2 VIEW COMPARISON:  None Available. FINDINGS: The heart size and mediastinal contours are within normal limits. Both lungs are clear. The visualized skeletal structures are unremarkable. IMPRESSION: No active cardiopulmonary disease. Electronically Signed   By: Rolm Baptise M.D.   On: 12/13/2021 21:21    Procedures Procedures    Medications Ordered in ED Medications - No data to display  ED Course/ Medical Decision Making/ A&P                           Medical Decision Making Risk OTC drugs. Prescription drug management. Decision regarding hospitalization.  Patient presents to the emergency department with chest pain. Patient nontoxic appearing, in no apparent distress, vitals without significant abnormality w/ mild bradycardia & HTN. Fairly benign physical exam.   DDX including but not limited to: ACS, pulmonary embolism, dissection, pneumothorax, pneumonia, arrhythmia, severe anemia, MSK, GERD, anxiety, abdominal process .   Additional history obtained:  Chart & nursing note reviewed.  Echo 10/2021:  IMPRESSIONS 1. Left ventricular ejection fraction, by estimation, is 60 to 65%. The  left ventricle has normal function. The left ventricle has no regional  wall motion abnormalities. Left ventricular diastolic parameters were  normal. The average left ventricular  global longitudinal strain is -23.5 %. The global longitudinal strain is  normal.    2. Right ventricular systolic function is normal. The right ventricular  size is normal.   3. The mitral valve is grossly normal. Mild mitral valve regurgitation.  No evidence of mitral stenosis.   4. The aortic valve is tricuspid. Aortic valve regurgitation is mild. No  aortic stenosis is present.   5. The inferior vena cava is normal in size with greater than 50%  respiratory variability, suggesting right atrial pressure of 3 mmHg.   EKG: Sinus rhythm Borderline prolonged PR interval RBBB and LPFB No previous tracing   Lab Tests:  I reviewed & interpreted  labs including:  CBC, BMP, troponins: Fairly unremarkable.  Imaging Studies ordered:  I ordered and viewed the following imaging, agree with radiologist impression:  No active cardiopulmonary disease.  ED Course:  Patient with chest pain.  Low risk Wells, low suspicion for PE.  Symmetric pulses, no widened mediastinum on chest x-ray, low suspicion for dissection.  Labs/imaging reassuring.  Abdominal exam is benign. Patient does have flat troponins without findings of STEMI, has had bundle branch block on EKG reviewed that was scanned from the office.  He does have a high risk heart pathway score with continued mild pain.  Heart pathway score high risk. Will discuss with cardiology. Aspirin & nitroglycerin ordered.   04:08: CONSULT: Gust with Dr. Blossom Hoops on-call for cardiology, recommends medicine admission for exercise stress echocardiogram.  04:30: RE-EVAL: Pain improved S/p nitroglycerin.   04:45: CONSULT: Discussed with hospitalist Dr. Myna Hidalgo- accepts admission.   Discussed w/ attending Dr. Betsey Holiday- in agreement.   Portions of this note were generated with Lobbyist. Dictation errors may occur despite best attempts at proofreading.   Final Clinical Impression(s) / ED Diagnoses Final diagnoses:  Chest pain, unspecified type    Rx / DC Orders ED Discharge Orders     None         Amaryllis Dyke,  PA-C 12/14/21 0447    Orpah Greek, MD 12/14/21 [phone]

## 2021-12-14 NOTE — H&P (Signed)
History and Physical    Steven Glenn ZOX:[id number] DOB: [birth year] DOA: 12/13/2021  PCP: Pa, [facility]   Patient coming from: Home   Chief Complaint: Chest pain   HPI: Steven Glenn is a pleasant 66 y.o. male with medical history significant for CAD with LAD stent placed in 2014 who presents to the emergency department with chest pain.  Patient reports that he woke in his usual state of health yesterday before going on to develop chest pain while running and playing with his dog at the park.  He describes a localized central pressure sensation with shortness of breath and diaphoresis.  Symptoms improved with rest but did not completely resolve and would worsen again with activity.  While trying to get some sleep last night, chest discomfort began to worsen again while at rest, and this prompted his presentation to the ED.  He describes the symptoms as very similar to what he experienced in 2014.  Until yesterday, he had never experienced this since 2014.  ED Course: Upon arrival to the ED, patient is found to be afebrile and saturating well on room air with bradycardia and stable blood pressure.  EKG features sinus rhythm with first-degree AV nodal block, RBBB, and LPFB.  Chest x-ray is negative for acute cardiopulmonary disease.  Troponin is normal x2.  BMP and CBC are unremarkable.  Patient was given 324 mg of aspirin in the ED.  He was given 1 sublingual nitroglycerin with resolution of his pain.  Cardiology (Dr. Blossom Hoops) was consulted by the ED PA and recommended admission for inpatient stress testing.  Review of Systems:  All other systems reviewed and apart from HPI, are negative.  Past Medical History:  Diagnosis Date   Allergy    Atypical mole 2021   left proximal post thigh lentiginous compound nevus Associated dermatology   Chest pain     Past Surgical History:  Procedure Laterality Date   CAROTID STENT     carpel tunnel      Social History:    reports that he has never smoked. He has never used smokeless tobacco. He reports current alcohol use. He reports that he does not use drugs.  Allergies  Allergen Reactions   Pollen Extract Cough    Family History  Problem Relation Age of Onset   Heart failure Mother    Heart disease Father    Heart disease Brother    Healthy Brother      Prior to Admission medications   Medication Sig Start Date End Date Taking? Authorizing Provider  aspirin 81 MG chewable tablet Chew 81 mg by mouth daily.   Yes [provider]  Famotidine (PEPCID AC PO) Take 1 tablet by mouth daily.   Yes [provider]  ibuprofen (ADVIL) 400 MG tablet Take 1 tablet (400 mg total) by mouth every 8 (eight) hours as needed for up to 30 doses. 06/14/21  Yes Lynden Oxford Scales, PA-C  rosuvastatin (CRESTOR) 20 MG tablet Take 1 tablet (20 mg total) by mouth daily. 08/25/21  Yes Elouise Munroe, MD  Clobetasol Prop Emollient Base (CLOBETASOL PROPIONATE E) 0.05 % emollient cream Apply to affected area- Do not use on face, folds or groin Patient not taking: Reported on 12/14/2021 03/15/21   Lavonna Monarch, MD    Physical Exam: Vitals:   12/13/21 2054 12/13/21 2055 12/14/21 0412 12/14/21 0419  BP: (!) 145/75  137/84   Pulse: (!) 54  (!) 46 (!) 46  Resp: 20  17 17  Temp: 98 F (36.7 C)   97.6 F (36.4 C)  TempSrc: Oral   Oral  SpO2: 100%  100% 100%  Weight:  80 kg    Height:  '5\' 11"'$  (1.803 m)      Constitutional: NAD, calm  Eyes: PERTLA, lids and conjunctivae normal ENMT: Mucous membranes are moist. Posterior pharynx clear of any exudate or lesions.   Neck: supple, no masses  Respiratory: no wheezing, no crackles. No accessory muscle use.  Cardiovascular: S1 & S2 heard, regular rate and rhythm. No extremity edema.   Abdomen: No distension, no tenderness, soft. Bowel sounds active.  Musculoskeletal: no clubbing / cyanosis. No joint deformity upper and lower extremities.   Skin: no  significant rashes, lesions, ulcers. Warm, dry, well-perfused. Neurologic: CN 2-12 grossly intact. Moving all extremities. Alert and oriented.  Psychiatric: Pleasant. Cooperative.    Labs and Imaging on Admission: I have personally reviewed following labs and imaging studies  CBC: Recent Labs  Lab 12/13/21 2131  WBC 5.5  HGB 14.8  HCT 42.3  MCV 92.6  PLT 671   Basic Metabolic Panel: Recent Labs  Lab 12/13/21 2131  NA 140  K 3.8  CL 110  CO2 24  GLUCOSE 103*  BUN 19  CREATININE 1.00  CALCIUM 9.1   GFR: Estimated Creatinine Clearance: 78.4 mL/min (by C-G formula based on SCr of 1 mg/dL). Liver Function Tests: No results for input(s): "AST", "ALT", "ALKPHOS", "BILITOT", "PROT", "ALBUMIN" in the last 168 hours. No results for input(s): "LIPASE", "AMYLASE" in the last 168 hours. No results for input(s): "AMMONIA" in the last 168 hours. Coagulation Profile: No results for input(s): "INR", "PROTIME" in the last 168 hours. Cardiac Enzymes: No results for input(s): "CKTOTAL", "CKMB", "CKMBINDEX", "TROPONINI" in the last 168 hours. BNP (last 3 results) No results for input(s): "PROBNP" in the last 8760 hours. HbA1C: No results for input(s): "HGBA1C" in the last 72 hours. CBG: No results for input(s): "GLUCAP" in the last 168 hours. Lipid Profile: No results for input(s): "CHOL", "HDL", "LDLCALC", "TRIG", "CHOLHDL", "LDLDIRECT" in the last 72 hours. Thyroid Function Tests: No results for input(s): "TSH", "T4TOTAL", "FREET4", "T3FREE", "THYROIDAB" in the last 72 hours. Anemia Panel: No results for input(s): "VITAMINB12", "FOLATE", "FERRITIN", "TIBC", "IRON", "RETICCTPCT" in the last 72 hours. Urine analysis: No results found for: "COLORURINE", "APPEARANCEUR", "LABSPEC", "PHURINE", "GLUCOSEU", "HGBUR", "BILIRUBINUR", "KETONESUR", "PROTEINUR", "UROBILINOGEN", "NITRITE", "LEUKOCYTESUR" Sepsis Labs: '@LABRCNTIP'$ (procalcitonin:4,lacticidven:4) )No results found for this or any  previous visit (from the past 240 hour(s)).   Radiological Exams on Admission: DG Chest 2 View  Result Date: 12/13/2021 CLINICAL DATA:  Chest pain, shortness of breath EXAM: CHEST - 2 VIEW COMPARISON:  None Available. FINDINGS: The heart size and mediastinal contours are within normal limits. Both lungs are clear. The visualized skeletal structures are unremarkable. IMPRESSION: No active cardiopulmonary disease. Electronically Signed   By: Rolm Baptise M.D.   On: 12/13/2021 21:21    EKG: Independently reviewed. SR, 1st degree AVB, chronic RBBB, LPFB.   Assessment/Plan   1. Chest pain  - Pt with CAD s/p LAD stent in 2014 presents with chest pain that is worse with exertion, improved with rest, and resolved with 1 SL NTG in ED  - HS troponin is normal x2, no acute ischemic features noted on EKG, and no acute findings on CXR  - Appreciate cardiology consultation, cardiology fellow recommending admission for inpatient stress test  - Continue cardiac monitoring, continue ASA and statin, no beta-blocker d/t bradycardia  DVT prophylaxis: Lovenox  Code Status: Full  Level of Care:  Level of care: Telemetry Cardiac Family Communication: None present  Disposition Plan:  Patient is from: home  Anticipated d/c is to: Home   Anticipated d/c date is: Possibly as early as 12/15/21  Patient currently: pending cardiology consultation  Consults called: Cardiology  Admission status: observation     Vianne Bulls, MD [facility]  12/14/2021, 5:27 AM

## 2021-12-14 NOTE — Interval H&P Note (Signed)
History and Physical Interval Note:  12/14/2021 5:23 PM  Steven Glenn  has presented today for surgery, with the diagnosis of Unstable Angina.  The various methods of treatment have been discussed with the patient and family. After consideration of risks, benefits and other options for treatment, the patient has consented to  Procedure(s): LEFT HEART CATH AND CORONARY ANGIOGRAPHY (N/A)  PERCUTANEOUS CORONARY INTERVENTION   as a surgical intervention.  The patient's history has been reviewed, patient examined, no change in status, stable for surgery.  I have reviewed the patient's chart and labs.  Questions were answered to the patient's satisfaction.    Cath Lab Visit (complete for each Cath Lab visit)  Clinical Evaluation Leading to the Procedure:   ACS: Yes.   - unstable angina  Non-ACS:    Anginal Classification: CCS IV  Anti-ischemic medical therapy: No Therapy  Non-Invasive Test Results: No non-invasive testing performed  Prior CABG: No previous CABG    [name]

## 2021-12-14 NOTE — Progress Notes (Signed)
ANTICOAGULATION CONSULT NOTE - Initial Consult  Pharmacy Consult for heparin  Indication: ACS/STEMI  Allergies  Allergen Reactions   Pollen Extract Cough    Patient Measurements: Height: '5\' 11"'$  (180.3 cm) Weight: 80 kg (176 lb 5.9 oz) IBW/kg (Calculated) : 75.3 Heparin Dosing Weight: 80 kg   Vital Signs: Temp: 97.2 F (36.2 C) (09/12 1209) Temp Source: Oral (09/12 1209) BP: 116/69 (09/12 1200) Pulse Rate: 50 (09/12 1200)  Labs: Recent Labs    12/13/21 2131 12/13/21 2257  HGB 14.8  --   HCT 42.3  --   PLT 172  --   CREATININE 1.00  --   TROPONINIHS 4 3    Estimated Creatinine Clearance: 78.4 mL/min (by C-G formula based on SCr of 1 mg/dL).   Medical History: Past Medical History:  Diagnosis Date   Allergy    Atypical mole 2021   left proximal post thigh lentiginous compound nevus Associated dermatology   Chest pain     Medications:  No anticoagulants PTA  Assessment: Pharmacy consulted to dose heparin for ACS/STEMI in 66 yo M. No anticoagulant PTA LMWH 40 given @ 06 am CBC WNL, SCr WNL  Goal of Therapy:  Heparin level 0.3-0.7 units/ml Monitor platelets by anticoagulation protocol: Yes   Plan:  Heparin bolus 4000 units Heparin drip 950 units/hr Check heparin level in 8 hours Daily CBC & heparin level To transfer to [facility] for possible LHC today  Eudelia Bunch, Pharm.D 12/14/2021 1:26 PM

## 2021-12-15 ENCOUNTER — Other Ambulatory Visit (HOSPITAL_COMMUNITY): Payer: TRICARE For Life (TFL)

## 2021-12-15 ENCOUNTER — Observation Stay (HOSPITAL_BASED_OUTPATIENT_CLINIC_OR_DEPARTMENT_OTHER): Payer: Medicare Other

## 2021-12-15 DIAGNOSIS — E7800 Pure hypercholesterolemia, unspecified: Secondary | ICD-10-CM | POA: Diagnosis not present

## 2021-12-15 DIAGNOSIS — R079 Chest pain, unspecified: Secondary | ICD-10-CM | POA: Diagnosis not present

## 2021-12-15 DIAGNOSIS — I2510 Atherosclerotic heart disease of native coronary artery without angina pectoris: Secondary | ICD-10-CM | POA: Diagnosis not present

## 2021-12-15 DIAGNOSIS — I25110 Atherosclerotic heart disease of native coronary artery with unstable angina pectoris: Secondary | ICD-10-CM | POA: Diagnosis not present

## 2021-12-15 DIAGNOSIS — I2583 Coronary atherosclerosis due to lipid rich plaque: Secondary | ICD-10-CM | POA: Diagnosis not present

## 2021-12-15 LAB — ECHOCARDIOGRAM COMPLETE
AR max vel: 2.41 cm2
AV Area VTI: 2.39 cm2
AV Area mean vel: 2.28 cm2
AV Mean grad: 4 mmHg
AV Peak grad: 7.1 mmHg
Ao pk vel: 1.33 m/s
Area-P 1/2: 3.15 cm2
Calc EF: 63.2 %
Height: 71 in
MV M vel: 2.94 m/s
MV Peak grad: 34.6 mmHg
P 1/2 time: 921 msec
S' Lateral: 3.1 cm
Single Plane A2C EF: 59.5 %
Single Plane A4C EF: 67.3 %
Weight: 2821.89 oz

## 2021-12-15 LAB — CBC
HCT: 40.7 % (ref 39.0–52.0)
Hemoglobin: 14.5 g/dL (ref 13.0–17.0)
MCH: 32.1 pg (ref 26.0–34.0)
MCHC: 35.6 g/dL (ref 30.0–36.0)
MCV: 90 fL (ref 80.0–100.0)
Platelets: 154 10*3/uL (ref 150–400)
RBC: 4.52 MIL/uL (ref 4.22–5.81)
RDW: 12.5 % (ref 11.5–15.5)
WBC: 4.5 10*3/uL (ref 4.0–10.5)
nRBC: 0 % (ref 0.0–0.2)

## 2021-12-15 LAB — BASIC METABOLIC PANEL
Anion gap: 5 (ref 5–15)
BUN: 12 mg/dL (ref 8–23)
CO2: 25 mmol/L (ref 22–32)
Calcium: 8.9 mg/dL (ref 8.9–10.3)
Chloride: 110 mmol/L (ref 98–111)
Creatinine, Ser: 1.06 mg/dL (ref 0.61–1.24)
GFR, Estimated: >60 mL/min (ref >60)
Glucose, Bld: 93 mg/dL (ref 70–99)
Potassium: 4.1 mmol/L (ref 3.5–5.1)
Sodium: 140 mmol/L (ref 135–145)

## 2021-12-15 LAB — HIV ANTIBODY (ROUTINE TESTING W REFLEX): HIV Screen 4th Generation wRfx: NONREACTIVE

## 2021-12-15 LAB — LIPID PANEL
Cholesterol: 105 mg/dL (ref 0–200)
HDL: 56 mg/dL (ref >40)
LDL Cholesterol: 35 mg/dL (ref 0–99)
Total CHOL/HDL Ratio: 1.9 RATIO
Triglycerides: 71 mg/dL (ref <150)
VLDL: 14 mg/dL (ref 0–40)

## 2021-12-15 LAB — D-DIMER, QUANTITATIVE: D-Dimer, Quant: <0.27 ug/mL-FEU (ref 0.00–0.50)

## 2021-12-15 MED ORDER — PANTOPRAZOLE SODIUM 40 MG PO TBEC: 40.0000 mg | DELAYED_RELEASE_TABLET | Freq: Every day | ORAL | 0 refills | Status: AC

## 2021-12-15 NOTE — Progress Notes (Addendum)
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  Likely non cardiac pain.   Inpatient Medications    Scheduled Meds:  aspirin  81 mg Oral Daily   famotidine  10 mg Oral Daily   rosuvastatin  20 mg Oral Daily   sodium chloride flush  3 mL Intravenous Q12H   Continuous Infusions:  PRN Meds: acetaminophen, nitroGLYCERIN, ondansetron (ZOFRAN) IV   Vital Signs    Vitals:   12/15/21 0450 12/15/21 0451 12/15/21 0452 12/15/21 0453  BP:      Pulse: (!) 51 (!) 46 (!) 49 (!) 49  Resp:      Temp:      TempSrc:      SpO2: 98% 98% 97% 97%  Weight:      Height:        Intake/Output Summary (Last 24 hours) at 12/15/2021 0808 Last data filed at 12/15/2021 0500 Gross per 24 hour  Intake 1391.06 ml  Output 450 ml  Net 941.06 ml      12/13/2021    8:55 PM 08/25/2021    1:04 PM 07/26/2021    1:50 PM  Last 3 Weights  Weight (lbs) 176 lb 5.9 oz 177 lb 9.6 oz 172 lb  Weight (kg) 80 kg 80.559 kg 78.019 kg      Telemetry    NSR - Personally Reviewed  ECG    No new EKG to review - Personally Reviewed  Physical Exam    GEN: No acute distress.   Neck: No JVD Cardiac: RRR, no murmurs, rubs, or gallops.  Respiratory: Clear to auscultation bilaterally. GI: Soft, nontender, non-distended  MS: No edema; No deformity. Neuro:  Nonfocal  Psych: Normal affect   Labs    High Sensitivity Troponin:   Recent Labs  Lab 12/13/21 2131 12/13/21 2257  TROPONINIHS 4 3      Chemistry Recent Labs  Lab 12/13/21 2131 12/15/21 0437  NA 140 140  K 3.8 4.1  CL 110 110  CO2 24 25  GLUCOSE 103* 93  BUN 19 12  CREATININE 1.00 1.06  CALCIUM 9.1 8.9  GFRNONAA >60 >60  ANIONGAP 6 5     Hematology Recent Labs  Lab 12/13/21 2131 12/15/21 0437  WBC 5.5 4.5  RBC 4.57 4.52   HGB 14.8 14.5  HCT 42.3 40.7  MCV 92.6 90.0  MCH 32.4 32.1  MCHC 35.0 35.6  RDW 12.5 12.5  PLT 172 154    BNPNo results for input(s): "BNP", "PROBNP" in the last 168 hours.   DDimer No results for input(s): "DDIMER" in the last 168 hours.   CHA2DS2-VASc Score =     This indicates a  % annual risk of stroke. The patient's score is based upon:        Radiology    CARDIAC CATHETERIZATION  Result Date: 12/14/2021   Previously placed Mid LAD stent of unknown type is  widely patent.   The left ventricular systolic function is normal. The left ventricular ejection fraction is 55-65% by visual estimate. LV end diastolic pressure is normal. 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 Normal Circumflex and dominant RCA No culprit lesion to explain the  patient's symptoms.  Essentially angiographically normal with widely patent stent. PLAN Return to nursing unit for ongoing care.  He has been transferred to [facility] Look for noncardiac etiology for chest pain. Could consider vasodilator such as amlodipine for possible distal coronary spasm/microvascular disease.  However that I do not see any evidence of that on cath. [name], MD   DG Chest 2 View  Result Date: 12/13/2021 CLINICAL DATA:  Chest pain, shortness of breath EXAM: CHEST - 2 VIEW COMPARISON:  None Available. FINDINGS: The heart size and mediastinal contours are within normal limits. Both lungs are clear. The visualized skeletal structures are unremarkable. IMPRESSION: No active cardiopulmonary disease. Electronically Signed   By: Rolm Baptise M.D.   On: 12/13/2021 21:21    Cardiac Studies  2D echo 10/11/21 IMPRESSIONS    1. Left ventricular ejection fraction, by estimation, is 60 to 65%. The  left ventricle has normal function. The left ventricle has no  regional  wall motion abnormalities. Left ventricular diastolic parameters were  normal. The average left ventricular  global longitudinal strain is -23.5 %. The global longitudinal strain is  normal.   2. Right ventricular systolic function is normal. The right ventricular  size is normal.   3. The mitral valve is grossly normal. Mild mitral valve regurgitation.  No evidence of mitral stenosis.   4. The aortic valve is tricuspid. Aortic valve regurgitation is mild. No  aortic stenosis is present.   5. The inferior vena cava is normal in size with greater than 50%  respiratory variability, suggesting right atrial pressure of 3 mmHg.   Comparison(s): Prior images reviewed side by side. Mitral valve  regurgitation not as prominent as prior study.   Conclusion(s)/Recommendation(s): Otherwise normal echocardiogram, with  minor abnormalities described in the report.    Cardiac Cath 12/14/21 Conclusion      Previously placed Mid LAD stent of unknown type is  widely patent.   The left ventricular systolic function is normal. The left ventricular ejection fraction is 55-65% by visual estimate. LV end diastolic pressure is normal.   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  Normal Circumflex and dominant RCA No culprit lesion to explain the patient's symptoms.  Essentially angiographically normal with widely patent stent.     PLAN Return to nursing unit for ongoing care.  He has been transferred to [facility] Look for noncardiac etiology for chest pain.  Could consider vasodilator such as amlodipine for possible distal coronary spasm/microvascular disease.  However that I do not see any evidence of that on cath.  Patient Profile     66 y.o. male with a hx of CAD with LAD stent placed in 2014, melanoma, RBBB who  is being seen 12/14/2021 for the evaluation of chest pain at the request of Dr. Dwyane Dee.  Assessment & Plan    Chest pain -Patient presented complaining of intermittent chest pressure/fullness that had been intermittent. -Pain improved some with rest and much worse with any exertional activity and associated with diaphoresis and shortness of breath -No improvement with PPI and slightly improved with sublingual nitrates -hsTn negative x2 in the ED and EKG nonischemic -Pain identical to prior PCI in 2014 which she has not had since his PCI -cath yesterday with patent LAD stent and widely patent vessels with mid LAD myocardial bridge -2D echo pending to rule  out pericardial effusion -likely non cardiac CP>>would look for other causes of CP such as GI  ASCAD -Status post remote PCI in 2014 -Continue aspirin 81 mg daily, Crestor 20 mg daily -Cannot use beta-blocker due to resting bradycardia   Hyperlipidemia -LDL goal less than 70 -LDL 35 and HDL 56 this admi -Continue Crestor 20 mg daily   [facility] will sign off.   Medication Recommendations:  ASA '81mg'$  daily, Crestor '20mg'$  daily Other recommendations (labs, testing, etc):  none Follow up as an outpatient:  Dr. Margaretann Loveless in 2-3 weeks  For questions or updates, please contact [place] Please consult [email or web address] for contact info under        Signed, Fransico Him, MD  12/15/2021, 8:08 AM

## 2021-12-15 NOTE — Progress Notes (Signed)
Mobility Specialist - Progress Note    12/15/21 1156  Mobility  Activity Ambulated with assistance in hallway  Level of Assistance Standby assist, set-up cues, supervision of patient - no hands on  Assistive Device None  Distance Ambulated (ft) 960 ft  Activity Response Tolerated well  $Mobility charge 1 Mobility    Pre-mobility: 120/74 BP Post-mobility: 60 HR  Received pt in bed having no complaints and agreeable to mobility. Pt was asymptomatic throughout ambulation. Pt was left in room w/ call bell in reach and all needs met.   Larey Seat

## 2021-12-15 NOTE — Care Management Obs Status (Signed)
[facility] NOTIFICATION   Patient Details  Name: Steven Glenn MRN: [id number] Date of Birth: 1955-12-14   Medicare Observation Status Notification Given:  Yes    Bethena Roys, RN 12/15/2021, 3:51 PM

## 2021-12-16 NOTE — Discharge Summary (Signed)
Physician Discharge Summary   Patient: Steven Glenn MRN: [id number] DOB: [birth year]  Admit date:     12/13/2021  Discharge date: 12/15/2021  Discharge Physician: Alma Friendly   PCP: [facility]   Recommendations at discharge:   Follow-up with cardiology Follow-up with PCP  Discharge Diagnoses: Principal Problem:   Chest pain Active Problems:   Coronary artery disease involving native coronary artery of native heart without angina pectoris    Hospital Course: 66 years old male with PMH significant for CAD with LAD stent placed in 2014 presented in the ED with C/O: chest pain.  Patient describes chest pain as heaviness and getting worse with exertion while running and playing with his dog.  Chest pain resolves with rest and get worse again with activity. In the ED EKG shows first-degree AV block, RBBB, chest x-ray negative for acute infiltrate.  Troponin x3 negative.  Patient was given sublingual nitro.  Cardiology consulted.  Patient admitted for further management.    Assessment and Plan:  Atypical chest pain  Cardiac cath done 9/12 with patent LAD stents and widely patent vessels 2D echo unremarkable HS troponin is normal x2, no acute ischemic features noted on EKG, and no acute findings on CXR  D-dimer negative Reported felt like very bad heartburn, discharge patient on Protonix 40 mg daily Follow-up with PCP Continue aspirin, Crestor, no beta-blocker due to bradycardia       Consultants: Cardiology Procedures performed: Cardiac cath  Disposition: Home Diet recommendation:  Cardiac diet DISCHARGE MEDICATION: Allergies as of 12/15/2021       Reactions   Pollen Extract Cough        Medication List     STOP taking these medications    Clobetasol Prop Emollient Base 0.05 % emollient cream Commonly known as: Clobetasol Propionate E   PEPCID AC PO       TAKE these medications    aspirin 81 MG chewable tablet Chew 81 mg by  mouth daily.   ibuprofen 400 MG tablet Commonly known as: ADVIL Take 1 tablet (400 mg total) by mouth every 8 (eight) hours as needed for up to 30 doses.   pantoprazole 40 MG tablet Commonly known as: Protonix Take 1 tablet (40 mg total) by mouth daily.   rosuvastatin 20 MG tablet Commonly known as: CRESTOR Take 1 tablet (20 mg total) by mouth daily.        Follow-up Information     [facility] [employer]. Schedule an appointment as soon as possible for a visit in 1 week(s).   Contact information: [place] [address] [phone]         Elouise Munroe, MD .   Specialties: Cardiology, Radiology Contact information: [address] [place] [address] [phone]                Discharge Exam: Danley Danker Weights   12/13/21 2055  Weight: 80 kg   General: NAD  Cardiovascular: S1, S2 present Respiratory: CTAB Abdomen: Soft, nontender, nondistended, bowel sounds present Musculoskeletal: No bilateral pedal edema noted Skin: Normal Psychiatry: Normal mood   Condition at discharge: stable  The results of significant diagnostics from this hospitalization (including imaging, microbiology, ancillary and laboratory) are listed below for reference.   Imaging Studies: ECHOCARDIOGRAM COMPLETE  Result Date: 12/15/2021    ECHOCARDIOGRAM REPORT   Patient Name:   MINNIE SHI Date of Exam: 12/15/2021 Medical Rec #:  [id number]  Height:       71.0 in Accession #:    [id number]        Weight:       176.4 lb Date of Birth:  July 09, 1955         BSA:          1.999 m Patient Age:    66 years          BP:           116/58 mmHg Patient Gender: M                 HR:           56 bpm. Exam Location:  Inpatient Procedure: 2D Echo Indications:    chest pain  History:        Patient has prior history of Echocardiogram examinations, most                 recent 08/19/2020. CAD, Arrythmias:Bradycardia,                 Signs/Symptoms:Shortness  of Breath, Chest Pain and Fatigue; Risk                 Factors:Dyslipidemia.  Sonographer:    Harvie Junior Referring Phys: [place]  1. Left ventricular ejection fraction, by estimation, is 60 to 65%. The left ventricle has normal function. The left ventricle has no regional wall motion abnormalities. Left ventricular diastolic parameters were normal.  2. Right ventricular systolic function is normal. The right ventricular size is mildly enlarged. There is normal pulmonary artery systolic pressure. The estimated right ventricular systolic pressure is 64.6 mmHg.  3. The mitral valve is normal in structure. Trivial mitral valve regurgitation. No evidence of mitral stenosis.  4. The aortic valve is tricuspid. Aortic valve regurgitation is trivial. No aortic stenosis is present.  5. The inferior vena cava is normal in size with greater than 50% respiratory variability, suggesting right atrial pressure of 3 mmHg. FINDINGS  Left Ventricle: Left ventricular ejection fraction, by estimation, is 60 to 65%. The left ventricle has normal function. The left ventricle has no regional wall motion abnormalities. The left ventricular internal cavity size was normal in size. There is  no left ventricular hypertrophy. Left ventricular diastolic parameters were normal. Right Ventricle: The right ventricular size is mildly enlarged. No increase in right ventricular wall thickness. Right ventricular systolic function is normal. There is normal pulmonary artery systolic pressure. The tricuspid regurgitant velocity is 2.42  m/s, and with an assumed right atrial pressure of 3 mmHg, the estimated right ventricular systolic pressure is 80.3 mmHg. Left Atrium: Left atrial size was normal in size. Right Atrium: Right atrial size was normal in size. Pericardium: There is no evidence of pericardial effusion. Mitral Valve: The mitral valve is normal in structure. Trivial mitral valve regurgitation. No evidence of mitral  valve stenosis. Tricuspid Valve: The tricuspid valve is normal in structure. Tricuspid valve regurgitation is trivial. Aortic Valve: The aortic valve is tricuspid. Aortic valve regurgitation is trivial. Aortic regurgitation PHT measures 921 msec. No aortic stenosis is present. Aortic valve mean gradient measures 4.0 mmHg. Aortic valve peak gradient measures 7.1 mmHg. Aortic valve area, by VTI measures 2.39 cm. Pulmonic Valve: The pulmonic valve was normal in structure. Pulmonic valve regurgitation is not visualized. Aorta: The aortic root is normal in size and structure. Venous: The inferior vena cava is normal in size with greater than 50% respiratory variability, suggesting right  atrial pressure of 3 mmHg. IAS/Shunts: No atrial level shunt detected by color flow Doppler.  LEFT VENTRICLE PLAX 2D LVIDd:         4.90 cm     Diastology LVIDs:         3.10 cm     LV e' medial:    9.14 cm/s LV PW:         0.70 cm     LV E/e' medial:  8.0 LV IVS:        0.70 cm     LV e' lateral:   12.10 cm/s LVOT diam:     2.00 cm     LV E/e' lateral: 6.0 LV SV:         68 LV SV Index:   34 LVOT Area:     3.14 cm  LV Volumes (MOD) LV vol d, MOD A2C: 91.4 ml LV vol d, MOD A4C: 89.3 ml LV vol s, MOD A2C: 37.0 ml LV vol s, MOD A4C: 29.2 ml LV SV MOD A2C:     54.4 ml LV SV MOD A4C:     89.3 ml LV SV MOD BP:      57.3 ml RIGHT VENTRICLE RV Basal diam:  4.30 cm RV Mid diam:    3.40 cm RV S prime:     11.70 cm/s TAPSE (M-mode): 2.0 cm LEFT ATRIUM             Index        RIGHT ATRIUM           Index LA diam:        2.80 cm 1.40 cm/m   RA Area:     17.30 cm LA Vol (A2C):   39.2 ml 19.61 ml/m  RA Volume:   56.80 ml  28.42 ml/m LA Vol (A4C):   23.5 ml 11.76 ml/m LA Biplane Vol: 33.3 ml 16.66 ml/m  AORTIC VALVE                    PULMONIC VALVE AV Area (Vmax):    2.41 cm     PV Vmax:          1.40 m/s AV Area (Vmean):   2.28 cm     PV Peak grad:     7.8 mmHg AV Area (VTI):     2.39 cm     PR End Diast Vel: 2.48 msec AV Vmax:            133.00 cm/s AV Vmean:          90.100 cm/s AV VTI:            0.286 m AV Peak Grad:      7.1 mmHg AV Mean Grad:      4.0 mmHg LVOT Vmax:         102.00 cm/s LVOT Vmean:        65.300 cm/s LVOT VTI:          0.218 m LVOT/AV VTI ratio: 0.76 AI PHT:            921 msec  AORTA Ao Root diam: 3.20 cm Ao Asc diam:  3.10 cm MITRAL VALVE               TRICUSPID VALVE MV Area (PHT): 3.15 cm    TR Peak grad:   23.4 mmHg MV Decel Time: 241 msec    TR Vmax:        242.00 cm/s MR  Peak grad: 34.6 mmHg MR Vmax:      294.00 cm/s  SHUNTS MV E velocity: 73.00 cm/s  Systemic VTI:  0.22 m MV A velocity: 65.50 cm/s  Systemic Diam: 2.00 cm MV E/A ratio:  1.11 Dalton McleanMD Electronically signed by Franki Monte Signature Date/Time: 12/15/2021/2:37:35 PM    Final    CARDIAC CATHETERIZATION  Result Date: 12/14/2021   Previously placed Mid LAD stent of unknown type is  widely patent.   The left ventricular systolic function is normal. The left ventricular ejection fraction is 55-65% by visual estimate. LV end diastolic pressure is normal. 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 Normal Circumflex and dominant RCA No culprit lesion to explain the patient's symptoms.  Essentially angiographically normal with widely patent stent. PLAN Return to nursing unit for ongoing care.  He has been transferred to [facility] Look for noncardiac etiology for chest pain. Could consider vasodilator such as amlodipine for possible distal coronary spasm/microvascular disease.  However that I do not see any evidence of that on cath. [name], MD   DG Chest 2 View  Result Date: 12/13/2021 CLINICAL DATA:  Chest pain, shortness of breath EXAM: CHEST - 2 VIEW COMPARISON:  None Available. FINDINGS: The heart size and mediastinal contours are within normal limits. Both lungs are  clear. The visualized skeletal structures are unremarkable. IMPRESSION: No active cardiopulmonary disease. Electronically Signed   By: Rolm Baptise M.D.   On: 12/13/2021 21:21    Microbiology: No results found for this or any previous visit.  Labs: CBC: Recent Labs  Lab 12/13/21 2131 12/15/21 0437  WBC 5.5 4.5  HGB 14.8 14.5  HCT 42.3 40.7  MCV 92.6 90.0  PLT 172 063   Basic Metabolic Panel: Recent Labs  Lab 12/13/21 2131 12/15/21 0437  NA 140 140  K 3.8 4.1  CL 110 110  CO2 24 25  GLUCOSE 103* 93  BUN 19 12  CREATININE 1.00 1.06  CALCIUM 9.1 8.9   Liver Function Tests: No results for input(s): "AST", "ALT", "ALKPHOS", "BILITOT", "PROT", "ALBUMIN" in the last 168 hours. CBG: No results for input(s): "GLUCAP" in the last 168 hours.  Discharge time spent: less than 30 minutes.  Signed: Alma Friendly, MD [facility] 12/16/2021

## 2021-12-27 ENCOUNTER — Ambulatory Visit: Payer: TRICARE For Life (TFL) | Admitting: Internal Medicine

## 2022-01-20 ENCOUNTER — Other Ambulatory Visit: Payer: Self-pay | Admitting: Internal Medicine

## 2022-01-20 DIAGNOSIS — R7989 Other specified abnormal findings of blood chemistry: Secondary | ICD-10-CM

## 2022-01-27 ENCOUNTER — Ambulatory Visit
Admission: RE | Admit: 2022-01-27 | Discharge: 2022-01-27 | Disposition: A | Discharge status: Home / Self Care | Payer: Medicare Other | Source: Ambulatory Visit | Attending: Internal Medicine | Admitting: Internal Medicine

## 2022-01-27 DIAGNOSIS — R7989 Other specified abnormal findings of blood chemistry: Secondary | ICD-10-CM

## 2022-01-31 ENCOUNTER — Ambulatory Visit: Payer: TRICARE For Life (TFL) | Admitting: Internal Medicine

## 2022-03-09 ENCOUNTER — Ambulatory Visit: Admitting: Dermatology

## 2022-05-18 ENCOUNTER — Ambulatory Visit: Payer: Medicare Other | Attending: Internal Medicine | Admitting: Internal Medicine

## 2022-05-18 ENCOUNTER — Encounter: Payer: Self-pay | Admitting: Internal Medicine

## 2022-05-18 VITALS — BP 110/70 | HR 47 | Ht 70.0 in | Wt 180.2 lb

## 2022-05-18 DIAGNOSIS — I4510 Unspecified right bundle-branch block: Secondary | ICD-10-CM | POA: Diagnosis not present

## 2022-05-18 DIAGNOSIS — E785 Hyperlipidemia, unspecified: Secondary | ICD-10-CM | POA: Insufficient documentation

## 2022-05-18 DIAGNOSIS — I340 Nonrheumatic mitral (valve) insufficiency: Secondary | ICD-10-CM | POA: Insufficient documentation

## 2022-05-18 DIAGNOSIS — R079 Chest pain, unspecified: Secondary | ICD-10-CM | POA: Diagnosis present

## 2022-05-18 DIAGNOSIS — I2510 Atherosclerotic heart disease of native coronary artery without angina pectoris: Secondary | ICD-10-CM | POA: Insufficient documentation

## 2022-05-18 DIAGNOSIS — R001 Bradycardia, unspecified: Secondary | ICD-10-CM | POA: Insufficient documentation

## 2022-05-18 NOTE — Progress Notes (Signed)
Cardiology Office Note:    Date:  05/18/2022   ID:  RIORDAN HILAND, DOB 1956-03-22, MRN [id number]  PCP:  [facility]  Cardiologist:  Elouise Munroe, MD  Electrophysiologist:  None   Referring MD: [facility]   Chief Complaint/Reason for Referral: CAD  History of Present Illness:    Steven Glenn is a 67 y.o. male with a history of CAD, melanoma, elevated LFTs, carpal tunnel right hand.   2014- >90% blockage LAD, stent - Resolute stent. Plavix for 2 years, then aspirin alone.  Crestor 20 mg, well tolerated. Got heartburn with Lipitor. Snores, not screened for sleep apnea.   Since her last visit, he presented to the ED on 12/13/2021 with complaints of chest pains. He described the pain as pressure/heaviness/fullness with some associated shortness of breath and diaphoresis.  He was discharged on Protonix 40 mg daily and to continue aspirin, Crestor, no beta-blocker due to bradycardia.   Today, he is feeling overall well.  It was concluded at his recent hospitalization that he had acid reflux. He was prescribed protonix 40 mg daily for several weeks, now he is just taking it as needed.   He attributed the acid reflux to drinking 4-5 Diet Cokes a day at the time. Now he is drinking 4-5 Diet Cokes a month. He has noticed significant improvement since decreasing his consumption of Diet Coke. Marland Kitchen   He hasn't been running all winter but has been walking. Now that the weather is warming up, he plans to resume running soon.   He has been sleeping well. 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 He has been focusing on creative outlets and community programs.   We discussed his EKG in comparison to prior. No significant changes.   He has been compliant with aspirin 81 mg, rosuvastatin 20 mg. He takes ibuprofen 400 mg as needed for occasional arthritis flare-up in right hand at night time.   We discussed his recent lipid panel from  01/2022: LDL 44, Trig 53, HDL 67.   He denies any palpitations, chest pain, shortness of breath, or peripheral edema. No lightheadedness, headaches, syncope, orthopnea, or PND.  Past Medical History:  Diagnosis Date   Allergy    Atypical mole 2021   left proximal post thigh lentiginous compound nevus Associated dermatology   Chest pain     Past Surgical History:  Procedure Laterality Date   CAROTID STENT     carpel tunnel     LEFT HEART CATH AND CORONARY ANGIOGRAPHY N/A 12/14/2021   Procedure: LEFT HEART CATH AND CORONARY ANGIOGRAPHY;  Surgeon: Leonie Man, MD;  Location: [facility];  Service: Cardiovascular;  Laterality: N/A;    Current Medications: Current Meds  Medication Sig   aspirin 81 MG chewable tablet Chew 81 mg by mouth daily.   ibuprofen (ADVIL) 400 MG tablet Take 1 tablet (400 mg total) by mouth every 8 (eight) hours as needed for up to 30 doses.   rosuvastatin (CRESTOR) 20 MG tablet Take 1 tablet (20 mg total) by mouth daily.     Allergies:   Pollen extract   Social History   Tobacco Use   Smoking status: Never   Smokeless tobacco: Never  Vaping Use   Vaping Use: Never used  Substance Use Topics   Alcohol use: Yes   Drug use: Never     Family History: The patient's family history includes Healthy in his brother; Heart disease in his  brother and father; Heart failure in his mother.  ROS:   Please see the history of present illness.    (+) Heartburn / Acid reflux (+) Joint pain (occasional arthritis flare-ups in hands) All other systems reviewed and are negative.  EKGs/Labs/Other Studies Reviewed:    The following studies were reviewed today:  Echo 12/2021: IMPRESSIONS   1. Left ventricular ejection fraction, by estimation, is 60 to 65%. The  left ventricle has normal function. The left ventricle has no regional  wall motion abnormalities. Left ventricular diastolic parameters were  normal.   2. Right ventricular systolic function is  normal. The right ventricular  size is mildly enlarged. There is normal pulmonary artery systolic  pressure. The estimated right ventricular systolic pressure is 99991111 mmHg.   3. The mitral valve is normal in structure. Trivial mitral valve  regurgitation. No evidence of mitral stenosis.   4. The aortic valve is tricuspid. Aortic valve regurgitation is trivial.  No aortic stenosis is present.   5. The inferior vena cava is normal in size with greater than 50%  respiratory variability, suggesting right atrial pressure of 3 mmHg.    EKG:  EKG is personally reviewed.  05/18/2022: Sinus bradycardia. Rate 47 bpm. 1st AV block. RBBB 12/14/2021: Sinus rhythm Borderline prolonged PR interval RBBB and LPFB No previous tracing  08/25/2021: Sinus bradycardia, RBBB, 158 ms   Recent Labs: 12/15/2021: BUN 12; Creatinine, Ser 1.06; Hemoglobin 14.5; Platelets 154; Potassium 4.1; Sodium 140  Recent Lipid Panel    Component Value Date/Time   CHOL 105 12/15/2021 0437   TRIG 71 12/15/2021 0437   HDL 56 12/15/2021 0437   CHOLHDL 1.9 12/15/2021 0437   VLDL 14 12/15/2021 0437   LDLCALC 35 12/15/2021 0437    Physical Exam:    VS:  BP 110/70   Pulse (!) 47   Ht '5\' 10"'$  (1.778 m)   Wt 180 lb 3.2 oz (81.7 kg)   SpO2 97%   BMI 25.86 kg/m     Wt Readings from Last 5 Encounters:  05/18/22 180 lb 3.2 oz (81.7 kg)  12/13/21 176 lb 5.9 oz (80 kg)  08/25/21 177 lb 9.6 oz (80.6 kg)  07/26/21 172 lb (78 kg)  07/06/20 178 lb 6.4 oz (80.9 kg)    Constitutional: No acute distress Eyes: sclera non-icteric, normal conjunctiva and lids ENMT: normal dentition, moist mucous membranes Cardiovascular: regular rhythm, bradycardic rate, no murmurs. S1 and S2 normal. Radial pulses normal bilaterally. No jugular venous distention.  Respiratory: clear to auscultation bilaterally GI : normal bowel sounds, soft and nontender. No distention.   MSK: extremities warm, well perfused. No edema.  NEURO: grossly nonfocal exam,  moves all extremities. PSYCH: alert and oriented x 3, normal mood and affect.   ASSESSMENT:    1. Coronary artery disease involving native coronary artery of native heart without angina pectoris   2. Mitral valve insufficiency, unspecified etiology   3. Hyperlipidemia LDL goal <70   4. RBBB   5. Chest pain, unspecified type   6. Sinus bradycardia     PLAN:    Coronary artery disease involving native coronary artery of native heart without angina pectoris - Plan: EKG 12-Lead Chest pain - noncardiac (GI) -Clinically stable without angina.  Continue aspirin 81 mg daily and rosuvastatin 20 mg daily.   Hyperlipidemia LDL goal <70 - lipids well controlled on crestor, continue.   Right bundle branch block Sinus bradycardia - Clinically asymptomatic with excellent exercise tolerance.  Red flag symptoms  of conduction system disease reviewed today.  Mitral valve regurgitation -mild on my independent review of images. Repeat echo in 2025.     Total time of encounter: 25 minutes total time of encounter, including 17 minutes spent in face-to-face patient care on the date of this encounter. This time includes coordination of care and counseling regarding above mentioned problem list. Remainder of non-face-to-face time involved reviewing chart documents/testing relevant to the patient encounter and documentation in the medical record. I have independently reviewed documentation from referring provider.   Cherlynn Kaiser, MD, [facility]     Medication Adjustments/Labs and Tests Ordered: Current medicines are reviewed at length with the patient today.  Concerns regarding medicines are outlined above.   Orders Placed This Encounter  Procedures   EKG 12-Lead   No orders of the defined types were placed in this encounter.  Patient Instructions  Medication Instructions:  No Changes In Medications at this time.  *If you need a refill on your cardiac medications  before your next appointment, please call your pharmacy*  Follow-Up: At [facility], you and your health needs are our priority.  As part of our continuing mission to provide you with exceptional heart care, we have created designated Provider Care Teams.  These Care Teams include your primary Cardiologist (physician) and Advanced Practice Providers (APPs -  Physician Assistants and Nurse Practitioners) who all work together to provide you with the care you need, when you need it.  Your next appointment:   1 year(s)  Provider:   Elouise Munroe, MD       I,Rachel Rivera,acting as a scribe for Elouise Munroe, MD.,have documented all relevant documentation on the behalf of Elouise Munroe, MD,as directed by  Elouise Munroe, MD while in the presence of Elouise Munroe, MD.  I, Elouise Munroe, MD, have reviewed all documentation for the visit on 05/18/2022. The documentation on today's date of service for the exam, diagnosis, procedures, and orders are all accurate and complete.

## 2022-05-18 NOTE — Patient Instructions (Signed)
Medication Instructions:  No Changes In Medications at this time.  *If you need a refill on your cardiac medications before your next appointment, please call your pharmacy*  Follow-Up: At [facility], you and your health needs are our priority.  As part of our continuing mission to provide you with exceptional heart care, we have created designated Provider Care Teams.  These Care Teams include your primary Cardiologist (physician) and Advanced Practice Providers (APPs -  Physician Assistants and Nurse Practitioners) who all work together to provide you with the care you need, when you need it.  Your next appointment:   1 year(s)  Provider:   Elouise Munroe, MD

## 2022-09-03 ENCOUNTER — Other Ambulatory Visit: Payer: Self-pay | Admitting: Internal Medicine

## 2022-09-23 ENCOUNTER — Encounter: Payer: Self-pay | Admitting: Internal Medicine

## 2022-09-23 MED ORDER — ROSUVASTATIN CALCIUM 20 MG PO TABS: 20.0000 mg | ORAL_TABLET | Freq: Every day | ORAL | 2 refills | Status: DC

## 2022-12-09 ENCOUNTER — Ambulatory Visit
Admission: EM | Admit: 2022-12-09 | Discharge: 2022-12-09 | Disposition: A | Discharge status: Home / Self Care | Payer: Medicare Other | Attending: Urgent Care | Admitting: Urgent Care

## 2022-12-09 DIAGNOSIS — N3000 Acute cystitis without hematuria: Secondary | ICD-10-CM | POA: Insufficient documentation

## 2022-12-09 LAB — POCT URINALYSIS DIP (MANUAL ENTRY)
Bilirubin, UA: NEGATIVE
Glucose, UA: NEGATIVE mg/dL
Ketones, POC UA: NEGATIVE mg/dL
Nitrite, UA: NEGATIVE
Protein Ur, POC: NEGATIVE mg/dL
Spec Grav, UA: <=1.005 — AB (ref 1.010–1.025)
Urobilinogen, UA: 0.2 U/dL
pH, UA: 6 (ref 5.0–8.0)

## 2022-12-09 MED ORDER — PHENAZOPYRIDINE HCL 100 MG PO TABS: 100.0000 mg | ORAL_TABLET | Freq: Three times a day (TID) | ORAL | 0 refills | Status: AC | PRN

## 2022-12-09 MED ORDER — AMOXICILLIN-POT CLAVULANATE 875-125 MG PO TABS: 1.0000 | ORAL_TABLET | Freq: Two times a day (BID) | ORAL | 0 refills | Status: AC

## 2022-12-09 NOTE — ED Triage Notes (Signed)
Pt presents to UC w/ c/o painful urination, cloudy urine, and urinary frequency x3 days. Pt states he has been traveling a lot and not staying hydrated.

## 2022-12-09 NOTE — ED Provider Notes (Signed)
[facility]    CSN: [id number] Arrival date & time: 12/09/22  1024      History   Chief Complaint Chief Complaint  Patient presents with   Dysuria    HPI Steven Glenn is a 67 y.o. male.   Pleasant 67 year old male presents today due to concerns of UTI.  He has never had 1 in the past, but states he has been traveling extensively over the past several weeks.  Reports not drinking much water during this time.  Over the past several days, he has noticed cloudy urine, frequency, hesitancy, and dysuria.  No fever or flank pain.  No discharge or rash or swelling.   Dysuria Presenting symptoms: dysuria     Past Medical History:  Diagnosis Date   Allergy    Atypical mole 2021   left proximal post thigh lentiginous compound nevus Associated dermatology   Chest pain     Patient Active Problem List   Diagnosis Date Noted   Chest pain 12/14/2021   Chronic fatigue 06/08/2020   Coronary artery disease involving native coronary artery of native heart without angina pectoris 06/08/2020   Hyperlipidemia LDL goal <70 06/08/2020   History of melanoma 06/08/2020    Past Surgical History:  Procedure Laterality Date   CAROTID STENT     carpel tunnel     LEFT HEART CATH AND CORONARY ANGIOGRAPHY N/A 12/14/2021   Procedure: LEFT HEART CATH AND CORONARY ANGIOGRAPHY;  Surgeon: Marykay Lex, MD;  Location: [facility] INVASIVE CV LAB;  Service: Cardiovascular;  Laterality: N/A;       Home Medications    Prior to Admission medications   Medication Sig Start Date End Date Taking? Authorizing Provider  amoxicillin-clavulanate (AUGMENTIN) 875-125 MG tablet Take 1 tablet by mouth 2 (two) times daily with a meal for 7 days. 12/09/22 12/16/22 Yes Rajni Holsworth L, PA  phenazopyridine (PYRIDIUM) 100 MG tablet Take 1 tablet (100 mg total) by mouth 3 (three) times daily as needed for up to 2 days for pain. 12/09/22 12/11/22 Yes Jonte Shiller L, PA  aspirin 81 MG chewable tablet Chew 81 mg by  mouth daily.    [provider]  ibuprofen (ADVIL) 400 MG tablet Take 1 tablet (400 mg total) by mouth every 8 (eight) hours as needed for up to 30 doses. 06/14/21   Theadora Rama Scales, PA-C  pantoprazole (PROTONIX) 40 MG tablet Take 1 tablet (40 mg total) by mouth daily. 12/15/21 01/14/22  Briant Cedar, MD  rosuvastatin (CRESTOR) 20 MG tablet Take 1 tablet (20 mg total) by mouth daily. 09/23/22   Parke Poisson, MD    Family History Family History  Problem Relation Age of Onset   Heart failure Mother    Heart disease Father    Heart disease Brother    Healthy Brother     Social History Social History   Tobacco Use   Smoking status: Never   Smokeless tobacco: Never  Vaping Use   Vaping status: Never Used  Substance Use Topics   Alcohol use: Yes   Drug use: Never     Allergies   Pollen extract   Review of Systems Review of Systems  Genitourinary:  Positive for dysuria.  As per HPI   Physical Exam Triage Vital Signs ED Triage Vitals  Encounter Vitals Group     BP 12/09/22 1050 99/65     Systolic BP Percentile --      Diastolic BP Percentile --  Pulse Rate 12/09/22 1050 64     Resp 12/09/22 1050 16     Temp 12/09/22 1050 97.9 F (36.6 C)     Temp Source 12/09/22 1050 Oral     SpO2 12/09/22 1050 96 %     Weight --      Height --      Head Circumference --      Peak Flow --      Pain Score 12/09/22 1053 0     Pain Loc --      Pain Education --      Exclude from Growth Chart --    No data found.  Updated Vital Signs BP 99/65 (BP Location: Left Arm)   Pulse 64   Temp 97.9 F (36.6 C) (Oral)   Resp 16   SpO2 96%   Visual Acuity Right Eye Distance:   Left Eye Distance:   Bilateral Distance:    Right Eye Near:   Left Eye Near:    Bilateral Near:     Physical Exam Vitals and nursing note reviewed.  Constitutional:      General: He is not in acute distress.    Appearance: Normal appearance. He is well-developed. He is  not ill-appearing, toxic-appearing or diaphoretic.  HENT:     Head: Normocephalic and atraumatic.  Eyes:     Conjunctiva/sclera: Conjunctivae normal.  Cardiovascular:     Rate and Rhythm: Normal rate.  Pulmonary:     Effort: Pulmonary effort is normal. No respiratory distress.     Breath sounds: Normal breath sounds.  Abdominal:     General: Abdomen is flat. There is no distension.     Palpations: Abdomen is soft.     Tenderness: There is no abdominal tenderness. There is no right CVA tenderness or left CVA tenderness.  Musculoskeletal:        General: No swelling.     Cervical back: Neck supple.  Skin:    General: Skin is warm and dry.     Coloration: Skin is not jaundiced.     Findings: No erythema or rash.  Neurological:     General: No focal deficit present.     Mental Status: He is alert and oriented to person, place, and time.  Psychiatric:        Mood and Affect: Mood normal.      UC Treatments / Results  Labs (all labs ordered are listed, but only abnormal results are displayed) Labs Reviewed  POCT URINALYSIS DIP (MANUAL ENTRY) - Abnormal; Notable for the following components:      Result Value   Color, UA colorless (*)    Clarity, UA cloudy (*)    Spec Grav, UA <=1.005 (*)    Blood, UA small (*)    Leukocytes, UA Large (3+) (*)    All other components within normal limits  URINE CULTURE    EKG   Radiology No results found.  Procedures Procedures (including critical care time)  Medications Ordered in UC Medications - No data to display  Initial Impression / Assessment and Plan / UC Course  I have reviewed the triage vital signs and the nursing notes.  Pertinent labs & imaging results that were available during my care of the patient were reviewed by me and considered in my medical decision making (see chart for details).     Acute cystitis - sx consistent with UTI. Pt denies sx of prostatitis and has no hx of BPH. Will start Augmentin BID x  7  days. Piridium PRN. RTC precautions reviewed. Urine culture sent out.    Final Clinical Impressions(s) / UC Diagnoses   Final diagnoses:  Acute cystitis without hematuria     Discharge Instructions      Your symptoms are consistent with a urinary tract infection. Start taking the antibiotic twice daily with food until completed.  You can take Pyridium up to 3 times daily for maximum of 2 days, this is only to help with the frequency and the pain.  Take on an as-needed basis. This will turn your pee a dark orange color.  We will send out your urine culture and only call if a change in treatment is necessary.  Monitor for any change in or worsening symptoms; fever, hematuria or flank pain would warrant a recheck      ED Prescriptions     Medication Sig Dispense Auth. Provider   amoxicillin-clavulanate (AUGMENTIN) 875-125 MG tablet Take 1 tablet by mouth 2 (two) times daily with a meal for 7 days. 14 tablet Freedom Peddy L, PA   phenazopyridine (PYRIDIUM) 100 MG tablet Take 1 tablet (100 mg total) by mouth 3 (three) times daily as needed for up to 2 days for pain. 6 tablet Maurico Perrell L, [place]      PDMP not reviewed this encounter.   Maretta Bees, [place] 12/09/22 1150

## 2022-12-09 NOTE — Discharge Instructions (Addendum)
Your symptoms are consistent with a urinary tract infection. Start taking the antibiotic twice daily with food until completed.  You can take Pyridium up to 3 times daily for maximum of 2 days, this is only to help with the frequency and the pain.  Take on an as-needed basis. This will turn your pee a dark orange color.  We will send out your urine culture and only call if a change in treatment is necessary.  Monitor for any change in or worsening symptoms; fever, hematuria or flank pain would warrant a recheck

## 2022-12-11 ENCOUNTER — Telehealth: Payer: Self-pay

## 2022-12-11 LAB — URINE CULTURE: Culture: <10000 — AB

## 2022-12-11 NOTE — Telephone Encounter (Signed)
Pt called about test results. Pt informed to continue to take antibiotic until completion. Pt was understanding.

## 2023-02-11 ENCOUNTER — Ambulatory Visit
Admission: RE | Admit: 2023-02-11 | Discharge: 2023-02-11 | Disposition: A | Discharge status: Home / Self Care | Payer: Medicare Other | Source: Ambulatory Visit | Attending: Internal Medicine | Admitting: Internal Medicine

## 2023-02-11 VITALS — BP 116/72 | HR 53 | Temp 97.7°F | Resp 16

## 2023-02-11 DIAGNOSIS — H60391 Other infective otitis externa, right ear: Secondary | ICD-10-CM

## 2023-02-11 MED ORDER — CIPROFLOXACIN-DEXAMETHASONE 0.3-0.1 % OT SUSP: 4.0000 [drp] | Freq: Two times a day (BID) | OTIC | 0 refills | Status: AC

## 2023-02-11 NOTE — ED Provider Notes (Signed)
[facility]  Note:  This document was prepared using Conservation officer, historic buildings and may include unintentional dictation errors.  MRN: [id number] DOB: 23-Nov-1955  Subjective:   Steven Glenn is a 67 y.o. male presenting for 2 to 3-day history of acute onset persistent and worsening right outer ear pain, ear swelling, redness.  Wears hearing aids and reports it is difficult to insert them now.  No drainage, fever.  No current facility-administered medications for this encounter.  Current Outpatient Medications:    acetaminophen (TYLENOL) 500 MG tablet, Take 500 mg by mouth every 6 (six) hours as needed., Disp: , Rfl:    aspirin 81 MG chewable tablet, Chew 81 mg by mouth daily., Disp: , Rfl:    ibuprofen (ADVIL) 400 MG tablet, Take 1 tablet (400 mg total) by mouth every 8 (eight) hours as needed for up to 30 doses., Disp: 30 tablet, Rfl: 0   pantoprazole (PROTONIX) 40 MG tablet, Take 1 tablet (40 mg total) by mouth daily., Disp: 30 tablet, Rfl: 0   rosuvastatin (CRESTOR) 20 MG tablet, Take 1 tablet (20 mg total) by mouth daily., Disp: 90 tablet, Rfl: 2   sildenafil (VIAGRA) 50 MG tablet, half tablet to 1 tablet 30 minutes prior to encounter, if not successful can increase to up to 100mg , max of 100mg /24hrs Orally Once a day for 30 days, Disp: , Rfl:    Allergies  Allergen Reactions   Pollen Extract Cough    Past Medical History:  Diagnosis Date   Allergy    Atypical mole 2021   left proximal post thigh lentiginous compound nevus Associated dermatology   Chest pain      Past Surgical History:  Procedure Laterality Date   CAROTID STENT     carpel tunnel     LEFT HEART CATH AND CORONARY ANGIOGRAPHY N/A 12/14/2021   Procedure: LEFT HEART CATH AND CORONARY ANGIOGRAPHY;  Surgeon: Marykay Lex, MD;  Location: [facility] INVASIVE CV LAB;  Service: Cardiovascular;  Laterality: N/A;    Family History  Problem Relation Age of Onset   Heart failure Mother     Heart disease Father    Heart disease Brother    Healthy Brother     Social History   Tobacco Use   Smoking status: Never   Smokeless tobacco: Never  Vaping Use   Vaping status: Never Used  Substance Use Topics   Alcohol use: Yes   Drug use: Never    ROS   Objective:   Vitals: BP 116/72 (BP Location: Right Arm)   Pulse (!) 53   Temp 97.7 F (36.5 C) (Oral)   Resp 16   SpO2 97%   Physical Exam Constitutional:      General: He is not in acute distress.    Appearance: Normal appearance. He is well-developed and normal weight. He is not ill-appearing, toxic-appearing or diaphoretic.  HENT:     Head: Normocephalic and atraumatic.     Right Ear: Tympanic membrane normal. There is no impacted cerumen.     Left Ear: Tympanic membrane, ear canal and external ear normal. There is no impacted cerumen.     Ears:     Comments: Exquisite tenderness of the external right ear canal with use of the otoscope, has associated erythema, 1+ swelling.    Nose: Nose normal.     Mouth/Throat:     Pharynx: Oropharynx is clear.  Eyes:     General: No scleral icterus.  Right eye: No discharge.        Left eye: No discharge.     Extraocular Movements: Extraocular movements intact.  Cardiovascular:     Rate and Rhythm: Normal rate.  Pulmonary:     Effort: Pulmonary effort is normal.  Musculoskeletal:     Cervical back: Normal range of motion.  Neurological:     Mental Status: He is alert and oriented to person, place, and time.  Psychiatric:        Mood and Affect: Mood normal.        Behavior: Behavior normal.        Thought Content: Thought content normal.        Judgment: Judgment normal.     Assessment and Plan :   PDMP not reviewed this encounter.  1. Infective otitis externa of right ear    Start Ciprodex to cover for otitis externa. Use supportive care otherwise. Counseled patient on potential for adverse effects with medications prescribed/recommended today, ER  and return-to-clinic precautions discussed, patient verbalized understanding.    Wallis Bamberg, PA-C 02/11/23 1229

## 2023-02-11 NOTE — ED Triage Notes (Signed)
Pt reports pain in right ear outer and inner ear. States his ear canal seems swollen as he has pain when he put his hearing aid. Tylenol gives some relief.

## 2023-03-31 ENCOUNTER — Ambulatory Visit
Admission: RE | Admit: 2023-03-31 | Discharge: 2023-03-31 | Disposition: A | Discharge status: Home / Self Care | Payer: Medicare Other | Source: Ambulatory Visit | Attending: Internal Medicine

## 2023-03-31 VITALS — BP 132/78 | HR 59 | Temp 98.0°F | Resp 18

## 2023-03-31 DIAGNOSIS — J40 Bronchitis, not specified as acute or chronic: Secondary | ICD-10-CM | POA: Diagnosis not present

## 2023-03-31 DIAGNOSIS — J329 Chronic sinusitis, unspecified: Secondary | ICD-10-CM

## 2023-03-31 MED ORDER — AMOXICILLIN-POT CLAVULANATE 875-125 MG PO TABS: 1.0000 | ORAL_TABLET | Freq: Two times a day (BID) | ORAL | 0 refills | Status: AC

## 2023-03-31 MED ORDER — PROMETHAZINE-DM 6.25-15 MG/5ML PO SYRP: 5.0000 mL | ORAL_SOLUTION | Freq: Four times a day (QID) | ORAL | 0 refills | Status: AC | PRN

## 2023-03-31 MED ORDER — ALBUTEROL SULFATE HFA 108 (90 BASE) MCG/ACT IN AERS: 1.0000 | INHALATION_SPRAY | Freq: Four times a day (QID) | RESPIRATORY_TRACT | 0 refills | Status: AC | PRN

## 2023-03-31 NOTE — ED Triage Notes (Addendum)
Pt presents to UC for c/o headaches, nasal drainage, chest congestion, sore throat x1 week. Took mucinex and alkaseltzer with some relief.

## 2023-03-31 NOTE — ED Provider Notes (Signed)
[facility]    CSN: [id number] Arrival date & time: 03/31/23  1712      History   Chief Complaint Chief Complaint  Patient presents with   Cough   Nasal Congestion    APPT 5:30    HPI Steven Glenn is a 67 y.o. male  presents for evaluation of URI symptoms for 7 days. Patient reports associated symptoms of cough, headaches, nasal drainage/sinus pressure, sore throat. Denies N/V/D, fevers, ear pain, body aches.  Does report some shortness of breath only when taking a deep breath.  Patient does not have a hx of asthma. Patient is not an active smoker.   Reports no sick contacts.  Pt has taken Mucinex and Alka-Seltzer OTC for symptoms. Pt has no other concerns at this time.    Cough Associated symptoms: headaches and sore throat     Past Medical History:  Diagnosis Date   Allergy    Atypical mole 2021   left proximal post thigh lentiginous compound nevus Associated dermatology   Chest pain     Patient Active Problem List   Diagnosis Date Noted   Chest pain 12/14/2021   Chronic fatigue 06/08/2020   Coronary artery disease involving native coronary artery of native heart without angina pectoris 06/08/2020   Hyperlipidemia LDL goal <70 06/08/2020   History of melanoma 06/08/2020    Past Surgical History:  Procedure Laterality Date   CAROTID STENT     carpel tunnel     LEFT HEART CATH AND CORONARY ANGIOGRAPHY N/A 12/14/2021   Procedure: LEFT HEART CATH AND CORONARY ANGIOGRAPHY;  Surgeon: Marykay Lex, MD;  Location: [facility] INVASIVE CV LAB;  Service: Cardiovascular;  Laterality: N/A;       Home Medications    Prior to Admission medications   Medication Sig Start Date End Date Taking? Authorizing Provider  albuterol (VENTOLIN HFA) 108 (90 Base) MCG/ACT inhaler Inhale 1-2 puffs into the lungs every 6 (six) hours as needed for wheezing or shortness of breath. 03/31/23  Yes Radford Pax, NP  amoxicillin-clavulanate (AUGMENTIN) 875-125 MG tablet Take 1  tablet by mouth every 12 (twelve) hours for 7 days. 03/31/23 04/07/23 Yes Radford Pax, NP  promethazine-dextromethorphan (PROMETHAZINE-DM) 6.25-15 MG/5ML syrup Take 5 mLs by mouth 4 (four) times daily as needed for cough. 03/31/23  Yes Radford Pax, NP  acetaminophen (TYLENOL) 500 MG tablet Take 500 mg by mouth every 6 (six) hours as needed.    [provider]  aspirin 81 MG chewable tablet Chew 81 mg by mouth daily.    [provider]  ciprofloxacin-dexamethasone (CIPRODEX) OTIC suspension Place 4 drops into the right ear 2 (two) times daily. 02/11/23   Wallis Bamberg, PA-C  ibuprofen (ADVIL) 400 MG tablet Take 1 tablet (400 mg total) by mouth every 8 (eight) hours as needed for up to 30 doses. 06/14/21   Theadora Rama Scales, PA-C  pantoprazole (PROTONIX) 40 MG tablet Take 1 tablet (40 mg total) by mouth daily. 12/15/21 01/14/22  Briant Cedar, MD  rosuvastatin (CRESTOR) 20 MG tablet Take 1 tablet (20 mg total) by mouth daily. 09/23/22   Parke Poisson, MD  sildenafil (VIAGRA) 50 MG tablet half tablet to 1 tablet 30 minutes prior to encounter, if not successful can increase to up to 100mg , max of 100mg /24hrs Orally Once a day for 30 days    [provider]    Family History Family History  Problem Relation Age of Onset   Heart failure  Mother    Heart disease Father    Heart disease Brother    Healthy Brother     Social History Social History   Tobacco Use   Smoking status: Never   Smokeless tobacco: Never  Vaping Use   Vaping status: Never Used  Substance Use Topics   Alcohol use: Yes   Drug use: Never     Allergies   Pollen extract   Review of Systems Review of Systems  HENT:  Positive for congestion, sinus pressure, sinus pain and sore throat.   Respiratory:  Positive for cough.   Neurological:  Positive for headaches.     Physical Exam Triage Vital Signs ED Triage Vitals  Encounter Vitals Group     BP 03/31/23 1812 132/78      Systolic BP Percentile --      Diastolic BP Percentile --      Pulse Rate 03/31/23 1812 (!) 59     Resp 03/31/23 1812 18     Temp 03/31/23 1812 98 F (36.7 C)     Temp Source 03/31/23 1812 Oral     SpO2 03/31/23 1812 96 %     Weight --      Height --      Head Circumference --      Peak Flow --      Pain Score 03/31/23 1810 0     Pain Loc --      Pain Education --      Exclude from Growth Chart --    No data found.  Updated Vital Signs BP 132/78 (BP Location: Right Arm)   Pulse (!) 59   Temp 98 F (36.7 C) (Oral)   Resp 18   SpO2 96%   Visual Acuity Right Eye Distance:   Left Eye Distance:   Bilateral Distance:    Right Eye Near:   Left Eye Near:    Bilateral Near:     Physical Exam Vitals and nursing note reviewed.  Constitutional:      General: He is not in acute distress.    Appearance: Normal appearance. He is not ill-appearing or toxic-appearing.  HENT:     Head: Normocephalic and atraumatic.     Right Ear: Tympanic membrane and ear canal normal.     Left Ear: Tympanic membrane and ear canal normal.     Nose: Congestion present.     Right Turbinates: Swollen and pale.     Left Turbinates: Swollen and pale.     Mouth/Throat:     Mouth: Mucous membranes are moist.     Pharynx: Posterior oropharyngeal erythema present.  Eyes:     Pupils: Pupils are equal, round, and reactive to light.  Cardiovascular:     Rate and Rhythm: Normal rate and regular rhythm.     Heart sounds: Normal heart sounds.  Pulmonary:     Effort: Pulmonary effort is normal. No respiratory distress.     Breath sounds: Normal breath sounds. No stridor. No wheezing, rhonchi or rales.  Musculoskeletal:     Cervical back: Normal range of motion and neck supple.  Lymphadenopathy:     Cervical: No cervical adenopathy.  Skin:    General: Skin is warm and dry.  Neurological:     General: No focal deficit present.     Mental Status: He is alert and oriented to person, place, and time.   Psychiatric:        Mood and Affect: Mood normal.  Behavior: Behavior normal.      UC Treatments / Results  Labs (all labs ordered are listed, but only abnormal results are displayed) Labs Reviewed - No data to display  EKG   Radiology No results found.  Procedures Procedures (including critical care time)  Medications Ordered in UC Medications - No data to display  Initial Impression / Assessment and Plan / UC Course  I have reviewed the triage vital signs and the nursing notes.  Pertinent labs & imaging results that were available during my care of the patient were reviewed by me and considered in my medical decision making (see chart for details).     Reviewed exam and symptoms with patient.  No red flags.  Will start Augmentin for sinusitis/bronchitis.  Promethazine DM as needed for cough, side effect profile reviewed.  Albuterol inhaler as needed for shortness of breath.  Lots of rest and fluids.  PCP follow-up as symptoms do not improve.  ER precautions reviewed. Final Clinical Impressions(s) / UC Diagnoses   Final diagnoses:  Sinobronchitis     Discharge Instructions      Start Augmentin twice daily for 7 days.  Promethazine DM as needed for cough.  Please note this medication can make you drowsy.  Do not drink alcohol or drive while on this medication.  Albuterol inhaler as needed for shortness of breath.  Please follow-up with your PCP in 2 days for recheck.  Please go to the ER if you develop any worsening symptoms.  I hope you feel better soon!    ED Prescriptions     Medication Sig Dispense Auth. Provider   amoxicillin-clavulanate (AUGMENTIN) 875-125 MG tablet Take 1 tablet by mouth every 12 (twelve) hours for 7 days. 14 tablet Radford Pax, NP   promethazine-dextromethorphan (PROMETHAZINE-DM) 6.25-15 MG/5ML syrup Take 5 mLs by mouth 4 (four) times daily as needed for cough. 118 mL Radford Pax, NP   albuterol (VENTOLIN HFA) 108 (90 Base)  MCG/ACT inhaler Inhale 1-2 puffs into the lungs every 6 (six) hours as needed for wheezing or shortness of breath. 1 each Radford Pax, NP      PDMP not reviewed this encounter.   Radford Pax, NP 03/31/23 1840

## 2023-03-31 NOTE — Discharge Instructions (Addendum)
Start Augmentin twice daily for 7 days.  Promethazine DM as needed for cough.  Please note this medication can make you drowsy.  Do not drink alcohol or drive while on this medication.  Albuterol inhaler as needed for shortness of breath.  Please follow-up with your PCP in 2 days for recheck.  Please go to the ER if you develop any worsening symptoms.  I hope you feel better soon!

## 2023-04-14 ENCOUNTER — Ambulatory Visit
Admission: RE | Admit: 2023-04-14 | Discharge: 2023-04-14 | Disposition: A | Discharge status: Home / Self Care | Payer: Medicare Other | Source: Ambulatory Visit | Attending: Family Medicine | Admitting: Family Medicine

## 2023-04-14 VITALS — BP 107/68 | HR 66 | Temp 97.5°F | Resp 17

## 2023-04-14 DIAGNOSIS — J329 Chronic sinusitis, unspecified: Secondary | ICD-10-CM

## 2023-04-14 DIAGNOSIS — B9789 Other viral agents as the cause of diseases classified elsewhere: Secondary | ICD-10-CM

## 2023-04-14 MED ORDER — FLUTICASONE PROPIONATE 50 MCG/ACT NA SUSP: 1.0000 | Freq: Every day | NASAL | 0 refills | Status: AC

## 2023-04-14 MED ORDER — DOXYCYCLINE HYCLATE 100 MG PO CAPS: 100.0000 mg | ORAL_CAPSULE | Freq: Two times a day (BID) | ORAL | 0 refills | Status: AC

## 2023-04-14 MED ORDER — PREDNISONE 20 MG PO TABS: 40.0000 mg | ORAL_TABLET | Freq: Every day | ORAL | 0 refills | Status: AC

## 2023-04-14 NOTE — Discharge Instructions (Addendum)
 Start Flonase  and prednisone  as prescribed.  Continue nasal rinses as tolerated.  A provisional prescription for doxycycline  has been provided.  Please do not take unless your symptoms do not improve or worsen over 4 days.  Please follow-up with your PCP if your symptoms do not improve.  Please go to the ER for any worsening symptoms.  Hope you feel better soon!

## 2023-04-14 NOTE — ED Triage Notes (Signed)
 Pt presents with head congestion, headache and sore throat X 3 days.   Pt states he has taken abx for sinus infection and states the sxs came back.

## 2023-04-14 NOTE — ED Provider Notes (Signed)
 [facility]    CSN: [id number] Arrival date & time: 04/14/23  1258      History   Chief Complaint Chief Complaint  Patient presents with   Nasal Congestion    I was treated for a sinus infection and bronchitis on December 27. I finished the round of antibiotics and am beginning to have repeat symptoms. - Entered by patient    HPI Steven Glenn is a 68 y.o. male  presents for evaluation of URI symptoms for 3 days. Patient reports associated symptoms of sinus pressure/pain with purulent nasal discharge, sore throat, postnasal drip causing cough. Denies N/V/D, fevers, ear pain, body aches, shortness of breath. Patient does not have a hx of asthma. Patient is not an active smoker.   Reports no sick contacts.  He was seen in urgent care on 12/27 for sinus symptoms and was treated with Augmentin .  States symptoms completely resolved.  Pt has taken Alka-Seltzer OTC for symptoms. Pt has no other concerns at this time.   HPI  Past Medical History:  Diagnosis Date   Allergy    Atypical mole 2021   left proximal post thigh lentiginous compound nevus Associated dermatology   Chest pain     Patient Active Problem List   Diagnosis Date Noted   Chest pain 12/14/2021   Chronic fatigue 06/08/2020   Coronary artery disease involving native coronary artery of native heart without angina pectoris 06/08/2020   Hyperlipidemia LDL goal <70 06/08/2020   History of melanoma 06/08/2020    Past Surgical History:  Procedure Laterality Date   CAROTID STENT     carpel tunnel     LEFT HEART CATH AND CORONARY ANGIOGRAPHY N/A 12/14/2021   Procedure: LEFT HEART CATH AND CORONARY ANGIOGRAPHY;  Surgeon: Anner Alm ORN, MD;  Location: [facility] INVASIVE CV LAB;  Service: Cardiovascular;  Laterality: N/A;       Home Medications    Prior to Admission medications   Medication Sig Start Date End Date Taking? Authorizing Provider  doxycycline  (VIBRAMYCIN ) 100 MG capsule Take 1 capsule (100 mg  total) by mouth 2 (two) times daily for 7 days. 04/18/23 04/25/23 Yes Ameliarose Shark, Jodi R, NP  fluticasone  (FLONASE ) 50 MCG/ACT nasal spray Place 1 spray into both nostrils daily. 04/14/23  Yes Temesha Queener, Jodi R, NP  predniSONE  (DELTASONE ) 20 MG tablet Take 2 tablets (40 mg total) by mouth daily with breakfast for 5 days. 04/14/23 04/19/23 Yes Dreyden Rohrman, Jodi R, NP  acetaminophen  (TYLENOL ) 500 MG tablet Take 500 mg by mouth every 6 (six) hours as needed.    [provider]  albuterol  (VENTOLIN  HFA) 108 (90 Base) MCG/ACT inhaler Inhale 1-2 puffs into the lungs every 6 (six) hours as needed for wheezing or shortness of breath. 03/31/23   Loreda Myla SAUNDERS, NP  aspirin  81 MG chewable tablet Chew 81 mg by mouth daily.    [provider]  ciprofloxacin -dexamethasone  (CIPRODEX ) OTIC suspension Place 4 drops into the right ear 2 (two) times daily. 02/11/23   Christopher Savannah, PA-C  ibuprofen  (ADVIL ) 400 MG tablet Take 1 tablet (400 mg total) by mouth every 8 (eight) hours as needed for up to 30 doses. 06/14/21   Joesph Shaver Scales, PA-C  pantoprazole  (PROTONIX ) 40 MG tablet Take 1 tablet (40 mg total) by mouth daily. 12/15/21 01/14/22  Ezenduka, Nkeiruka J, MD  promethazine -dextromethorphan (PROMETHAZINE -DM) 6.25-15 MG/5ML syrup Take 5 mLs by mouth 4 (four) times daily as needed for cough. 03/31/23   Aretta Stetzel, Jodi R, NP  rosuvastatin  (CRESTOR ) 20 MG tablet Take 1 tablet (20 mg total) by mouth daily. 09/23/22   Acharya, Gayatri A, MD  sildenafil (VIAGRA) 50 MG tablet half tablet to 1 tablet 30 minutes prior to encounter, if not successful can increase to up to 100mg , max of 100mg /24hrs Orally Once a day for 30 days    [provider]    Family History Family History  Problem Relation Age of Onset   Heart failure Mother    Heart disease Father    Heart disease Brother    Healthy Brother     Social History Social History   Tobacco Use   Smoking status: Never   Smokeless tobacco: Never  Vaping Use    Vaping status: Never Used  Substance Use Topics   Alcohol use: Yes   Drug use: Never     Allergies   Pollen extract   Review of Systems Review of Systems  HENT:  Positive for congestion, postnasal drip, sinus pressure, sinus pain and sore throat.   Respiratory:  Positive for cough.      Physical Exam Triage Vital Signs ED Triage Vitals  Encounter Vitals Group     BP 04/14/23 1309 107/68     Systolic BP Percentile --      Diastolic BP Percentile --      Pulse Rate 04/14/23 1309 66     Resp 04/14/23 1309 17     Temp 04/14/23 1309 (!) 97.5 F (36.4 C)     Temp Source 04/14/23 1309 Oral     SpO2 04/14/23 1309 94 %     Weight --      Height --      Head Circumference --      Peak Flow --      Pain Score 04/14/23 1308 5     Pain Loc --      Pain Education --      Exclude from Growth Chart --    No data found.  Updated Vital Signs BP 107/68 (BP Location: Right Arm)   Pulse 66   Temp (!) 97.5 F (36.4 C) (Oral)   Resp 17   SpO2 94%   Visual Acuity Right Eye Distance:   Left Eye Distance:   Bilateral Distance:    Right Eye Near:   Left Eye Near:    Bilateral Near:     Physical Exam Vitals and nursing note reviewed.  Constitutional:      General: He is not in acute distress.    Appearance: Normal appearance. He is not ill-appearing or toxic-appearing.  HENT:     Head: Normocephalic and atraumatic.     Right Ear: Tympanic membrane and ear canal normal.     Left Ear: Tympanic membrane and ear canal normal.     Nose: Congestion present.     Mouth/Throat:     Mouth: Mucous membranes are moist.     Pharynx: Postnasal drip present. No oropharyngeal exudate or posterior oropharyngeal erythema.  Eyes:     Pupils: Pupils are equal, round, and reactive to light.  Cardiovascular:     Rate and Rhythm: Normal rate and regular rhythm.     Heart sounds: Normal heart sounds.  Pulmonary:     Effort: Pulmonary effort is normal.     Breath sounds: Normal breath  sounds.  Musculoskeletal:     Cervical back: Normal range of motion and neck supple.  Lymphadenopathy:     Cervical: No cervical adenopathy.  Skin:  General: Skin is warm and dry.  Neurological:     General: No focal deficit present.     Mental Status: He is alert and oriented to person, place, and time.  Psychiatric:        Mood and Affect: Mood normal.        Behavior: Behavior normal.      UC Treatments / Results  Labs (all labs ordered are listed, but only abnormal results are displayed) Labs Reviewed - No data to display  EKG   Radiology No results found.  Procedures Procedures (including critical care time)  Medications Ordered in UC Medications - No data to display  Initial Impression / Assessment and Plan / UC Course  I have reviewed the triage vital signs and the nursing notes.  Pertinent labs & imaging results that were available during my care of the patient were reviewed by me and considered in my medical decision making (see chart for details).     Reviewed exam and sx with patient. No red flags. Discussed viral sinusitis. Flonase  and prednisone . Provisions rx for doxy sent to pharmacy with instruction not to take unless sx do not improve or worsen over 4 days and pt verbalized understanding. PCP follow up if symptoms do not improve. ER precautions reviewed.  Final Clinical Impressions(s) / UC Diagnoses   Final diagnoses:  Viral sinusitis     Discharge Instructions      Start Flonase  and prednisone  as prescribed.  Continue nasal rinses as tolerated.  A provisional prescription for doxycycline  has been provided.  Please do not take unless your symptoms do not improve or worsen over 4 days.  Please follow-up with your PCP if your symptoms do not improve.  Please go to the ER for any worsening symptoms.  Hope you feel better soon!    ED Prescriptions     Medication Sig Dispense Auth. Provider   fluticasone  (FLONASE ) 50 MCG/ACT nasal spray Place 1  spray into both nostrils daily. 15.8 mL Chariah Bailey, Jodi R, NP   predniSONE  (DELTASONE ) 20 MG tablet Take 2 tablets (40 mg total) by mouth daily with breakfast for 5 days. 10 tablet Ladaija Dimino, Jodi R, NP   doxycycline  (VIBRAMYCIN ) 100 MG capsule Take 1 capsule (100 mg total) by mouth 2 (two) times daily for 7 days. 14 capsule Charlane Westry, Jodi R, NP      PDMP not reviewed this encounter.   Loreda Myla SAUNDERS, NP 04/14/23 1328

## 2023-05-26 ENCOUNTER — Ambulatory Visit: Payer: TRICARE For Life (TFL) | Admitting: Internal Medicine

## 2023-06-09 ENCOUNTER — Encounter: Payer: Self-pay | Admitting: Internal Medicine

## 2023-06-09 ENCOUNTER — Other Ambulatory Visit: Payer: Self-pay | Admitting: Internal Medicine

## 2023-06-09 MED ORDER — ROSUVASTATIN CALCIUM 20 MG PO TABS: 20.0000 mg | ORAL_TABLET | Freq: Every day | ORAL | 0 refills | Status: DC

## 2023-08-17 ENCOUNTER — Ambulatory Visit: Payer: TRICARE For Life (TFL) | Admitting: Internal Medicine

## 2023-09-19 ENCOUNTER — Encounter: Payer: Self-pay | Admitting: Internal Medicine

## 2023-09-19 ENCOUNTER — Ambulatory Visit: Payer: TRICARE For Life (TFL) | Attending: Internal Medicine | Admitting: Internal Medicine

## 2023-09-19 VITALS — BP 137/71 | HR 53 | Ht 70.0 in | Wt 170.6 lb

## 2023-09-19 DIAGNOSIS — E785 Hyperlipidemia, unspecified: Secondary | ICD-10-CM

## 2023-09-19 DIAGNOSIS — I2510 Atherosclerotic heart disease of native coronary artery without angina pectoris: Secondary | ICD-10-CM | POA: Diagnosis not present

## 2023-09-19 DIAGNOSIS — Z79899 Other long term (current) drug therapy: Secondary | ICD-10-CM

## 2023-09-19 DIAGNOSIS — I4510 Unspecified right bundle-branch block: Secondary | ICD-10-CM | POA: Diagnosis not present

## 2023-09-19 MED ORDER — ROSUVASTATIN CALCIUM 20 MG PO TABS: 20.0000 mg | ORAL_TABLET | Freq: Every day | ORAL | 3 refills | Status: AC

## 2023-09-19 NOTE — Patient Instructions (Signed)
 Medication Instructions:  Continue current medications Your Rosuvastatin  has been sent to your pharmacy for refills *If you need a refill on your cardiac medications before your next appointment, please call your pharmacy*  Lab Work: none If you have labs (blood work) drawn today and your tests are completely normal, you will receive your results only by: MyChart Message (if you have MyChart) OR A paper copy in the mail If you have any lab test that is abnormal or we need to change your treatment, we will call you to review the results.  Testing/Procedures:  none Follow-Up: At [facility], you and your health needs are our priority.  As part of our continuing mission to provide you with exceptional heart care, our providers are all part of one team.  This team includes your primary Cardiologist (physician) and Advanced Practice Providers or APPs (Physician Assistants and Nurse Practitioners) who all work together to provide you with the care you need, when you need it.  Your next appointment:   1 year(s)  Provider:   Euell Herrlich, MD    We recommend signing up for the patient portal called MyChart.  Sign up information is provided on this After Visit Summary.  MyChart is used to connect with patients for Virtual Visits (Telemedicine).  Patients are able to view lab/test results, encounter notes, upcoming appointments, etc.  Non-urgent messages can be sent to your provider as well.   To learn more about what you can do with MyChart, go to [email or web address].   Other Instructions none

## 2023-09-19 NOTE — Progress Notes (Signed)
  Cardiology Office Note:  .   Date:  09/19/2023  ID:  Dorn MARLA Cones, DOB [birth year], MRN [id number] PCP: Valentin Skates, DO  [facility] Providers Cardiologist:  Soyla DELENA Merck, MD    History of Present Illness: .   DENO SIDA is a 68 y.o. male.  Discussed the use of AI scribe software for clinical note transcription with the patient, who gave verbal consent to proceed.  History of Present Illness Dennison Mcdaid is a 68 year old male with coronary artery disease who presents for a cardiovascular follow-up.  He has a 90% blockage in the left anterior descending artery treated with a resolute stent in 2014. He was on Plavix for two years post-stent and is currently on aspirin  81 mg daily without bleeding issues. His most recent lipid panel in October 2024 showed an LDL of 48, well controlled on rosuvastatin  20 mg daily.  He feels well from a cardiovascular standpoint, with no chest discomfort, shortness of breath, or dizziness during exercise. He engages in running and yard work without fatigue, attributing his stamina to regular exercise since August of the previous year. He has a right bundle branch block on EKG with no symptoms such as dizziness or fainting.    ROS: negative except per HPI above.  Studies Reviewed: SABRA   EKG Interpretation Date/Time:  Tuesday September 19 2023 09:48:19 EDT Ventricular Rate:  55 PR Interval:  206 QRS Duration:  144 QT Interval:  452 QTC Calculation: 432 R Axis:   84  Text Interpretation: Sinus bradycardia Right bundle branch block No significant change since last tracing Confirmed by Merck Soyla ([id number]) on 09/19/2023 9:56:43 AM    Results LABS Lipid panel: LDL 48 (01/2023)  DIAGNOSTIC EKG: Right bundle branch block, stable Risk Assessment/Calculations:       Physical Exam:   VS:  BP 137/71   Pulse (!) 53   Ht 5' 10 (1.778 m)   Wt 170 lb 9.6 oz (77.4 kg)   SpO2 99%   BMI 24.48 kg/m    Wt Readings from  Last 3 Encounters:  09/19/23 170 lb 9.6 oz (77.4 kg)  05/18/22 180 lb 3.2 oz (81.7 kg)  12/13/21 176 lb 5.9 oz (80 kg)     Physical Exam GENERAL: Alert, cooperative, well developed, no acute distress HEENT: Normocephalic, normal oropharynx, moist mucous membranes CHEST: Clear to auscultation bilaterally, no wheezes, rhonchi, or crackles CARDIOVASCULAR: Normal heart rate and rhythm, S1 and S2 normal without murmurs ABDOMEN: Soft, non-tender, non-distended, without organomegaly, normal bowel sounds EXTREMITIES: No cyanosis or edema NEUROLOGICAL: Cranial nerves grossly intact, moves all extremities without gross motor or sensory deficit   ASSESSMENT AND PLAN: .    Assessment and Plan Assessment & Plan Coronary Artery Disease (CAD) with history of stent HLD Med mgmt 90% LAD blockage with resolute stent in 2014. Asymptomatic, well-controlled LDL at 48. No aspirin -related bleeding. Improved endurance. - Continue aspirin  81 mg daily. - Continue rosuvastatin  20 mg daily. - Refill rosuvastatin  prescription through [facility].  Right bundle branch block Right bundle branch block stable on EKG. No symptoms of dizziness, lightheadedness, syncope, or exercise intolerance. - Monitor for symptoms of dizziness, lightheadedness, syncope, or exercise intolerance.        Soyla Merck, MD, FACC

## 2024-09-24 ENCOUNTER — Ambulatory Visit: Admitting: Internal Medicine
